# Patient Record
Sex: Male | Born: 1960 | Race: Black or African American | Hispanic: No | Marital: Single | Smoking: Never smoker
Health system: Southern US, Community
[De-identification: ages and names within clinical notes are randomized; demographics above are authoritative.]

## PROBLEM LIST (undated history)

## (undated) ENCOUNTER — Emergency Department (HOSPITAL_BASED_OUTPATIENT_CLINIC_OR_DEPARTMENT_OTHER): Admission: EM | Payer: Medicare HMO | Source: Home / Self Care

## (undated) DIAGNOSIS — I10 Essential (primary) hypertension: Secondary | ICD-10-CM

## (undated) DIAGNOSIS — M109 Gout, unspecified: Secondary | ICD-10-CM

## (undated) DIAGNOSIS — K219 Gastro-esophageal reflux disease without esophagitis: Secondary | ICD-10-CM

## (undated) DIAGNOSIS — B20 Human immunodeficiency virus [HIV] disease: Secondary | ICD-10-CM

## (undated) DIAGNOSIS — E785 Hyperlipidemia, unspecified: Secondary | ICD-10-CM

## (undated) DIAGNOSIS — I4891 Unspecified atrial fibrillation: Secondary | ICD-10-CM

## (undated) DIAGNOSIS — E559 Vitamin D deficiency, unspecified: Secondary | ICD-10-CM

## (undated) DIAGNOSIS — I509 Heart failure, unspecified: Secondary | ICD-10-CM

## (undated) DIAGNOSIS — Z21 Asymptomatic human immunodeficiency virus [HIV] infection status: Secondary | ICD-10-CM

## (undated) HISTORY — PX: IMPLANTABLE CARDIOVERTER DEFIBRILLATOR IMPLANT: SHX5860

## (undated) HISTORY — PX: LAPAROSCOPIC GASTRIC SLEEVE RESECTION: SHX5895

## (undated) HISTORY — PX: PANNICULECTOMY: SUR1001

---

## 2012-10-12 DIAGNOSIS — M179 Osteoarthritis of knee, unspecified: Secondary | ICD-10-CM

## 2012-10-12 HISTORY — DX: Osteoarthritis of knee, unspecified: M17.9

## 2013-09-07 HISTORY — DX: Morbid (severe) obesity due to excess calories: E66.01

## 2015-04-17 DIAGNOSIS — I10 Essential (primary) hypertension: Secondary | ICD-10-CM | POA: Insufficient documentation

## 2015-04-17 DIAGNOSIS — I420 Dilated cardiomyopathy: Secondary | ICD-10-CM | POA: Insufficient documentation

## 2015-04-17 DIAGNOSIS — I509 Heart failure, unspecified: Secondary | ICD-10-CM | POA: Insufficient documentation

## 2015-04-17 DIAGNOSIS — Z21 Asymptomatic human immunodeficiency virus [HIV] infection status: Secondary | ICD-10-CM | POA: Insufficient documentation

## 2015-04-30 DIAGNOSIS — E1169 Type 2 diabetes mellitus with other specified complication: Secondary | ICD-10-CM

## 2015-04-30 HISTORY — DX: Type 2 diabetes mellitus with other specified complication: E11.69

## 2015-05-30 DIAGNOSIS — Z95810 Presence of automatic (implantable) cardiac defibrillator: Secondary | ICD-10-CM

## 2015-05-30 HISTORY — DX: Presence of automatic (implantable) cardiac defibrillator: Z95.810

## 2016-01-20 DIAGNOSIS — I5022 Chronic systolic (congestive) heart failure: Secondary | ICD-10-CM | POA: Insufficient documentation

## 2016-01-20 DIAGNOSIS — E7800 Pure hypercholesterolemia, unspecified: Secondary | ICD-10-CM | POA: Insufficient documentation

## 2016-01-20 DIAGNOSIS — Z9884 Bariatric surgery status: Secondary | ICD-10-CM

## 2016-01-20 HISTORY — DX: Bariatric surgery status: Z98.84

## 2017-07-01 DIAGNOSIS — H16223 Keratoconjunctivitis sicca, not specified as Sjogren's, bilateral: Secondary | ICD-10-CM

## 2017-07-01 HISTORY — DX: Keratoconjunctivitis sicca, not specified as Sjogren's, bilateral: H16.223

## 2018-01-04 DIAGNOSIS — J4550 Severe persistent asthma, uncomplicated: Secondary | ICD-10-CM

## 2018-01-04 DIAGNOSIS — G4730 Sleep apnea, unspecified: Secondary | ICD-10-CM

## 2018-01-04 HISTORY — DX: Sleep apnea, unspecified: G47.30

## 2018-01-04 HISTORY — DX: Severe persistent asthma, uncomplicated: J45.50

## 2018-01-05 DIAGNOSIS — E559 Vitamin D deficiency, unspecified: Secondary | ICD-10-CM | POA: Insufficient documentation

## 2018-01-22 ENCOUNTER — Emergency Department (HOSPITAL_BASED_OUTPATIENT_CLINIC_OR_DEPARTMENT_OTHER): Payer: Medicare HMO

## 2018-01-22 ENCOUNTER — Emergency Department (HOSPITAL_BASED_OUTPATIENT_CLINIC_OR_DEPARTMENT_OTHER)
Admission: EM | Admit: 2018-01-22 | Discharge: 2018-01-22 | Disposition: A | Discharge status: Home / Self Care | Payer: Medicare HMO | Attending: Emergency Medicine | Admitting: Emergency Medicine

## 2018-01-22 ENCOUNTER — Other Ambulatory Visit: Payer: Self-pay

## 2018-01-22 ENCOUNTER — Encounter (HOSPITAL_BASED_OUTPATIENT_CLINIC_OR_DEPARTMENT_OTHER): Payer: Self-pay | Admitting: Adult Health

## 2018-01-22 DIAGNOSIS — Z79899 Other long term (current) drug therapy: Secondary | ICD-10-CM | POA: Insufficient documentation

## 2018-01-22 DIAGNOSIS — L03115 Cellulitis of right lower limb: Secondary | ICD-10-CM

## 2018-01-22 DIAGNOSIS — I110 Hypertensive heart disease with heart failure: Secondary | ICD-10-CM | POA: Diagnosis not present

## 2018-01-22 DIAGNOSIS — M79661 Pain in right lower leg: Secondary | ICD-10-CM | POA: Diagnosis present

## 2018-01-22 DIAGNOSIS — I509 Heart failure, unspecified: Secondary | ICD-10-CM | POA: Diagnosis not present

## 2018-01-22 DIAGNOSIS — M7989 Other specified soft tissue disorders: Secondary | ICD-10-CM

## 2018-01-22 HISTORY — DX: Essential (primary) hypertension: I10

## 2018-01-22 HISTORY — DX: Vitamin D deficiency, unspecified: E55.9

## 2018-01-22 HISTORY — DX: Gastro-esophageal reflux disease without esophagitis: K21.9

## 2018-01-22 HISTORY — DX: Heart failure, unspecified: I50.9

## 2018-01-22 HISTORY — DX: Hyperlipidemia, unspecified: E78.5

## 2018-01-22 MED ORDER — SULFAMETHOXAZOLE-TRIMETHOPRIM 800-160 MG PO TABS: 1.0000 | ORAL_TABLET | Freq: Two times a day (BID) | ORAL | 0 refills | Status: AC

## 2018-01-22 MED ORDER — TRAMADOL HCL 50 MG PO TABS: 50.0000 mg | ORAL_TABLET | Freq: Four times a day (QID) | ORAL | 0 refills | Status: AC | PRN

## 2018-01-22 MED ORDER — CEPHALEXIN 500 MG PO CAPS: 500.0000 mg | ORAL_CAPSULE | Freq: Four times a day (QID) | ORAL | 0 refills | Status: DC

## 2018-01-22 NOTE — ED Provider Notes (Signed)
[facility] EMERGENCY DEPARTMENT Provider Note   CSN: [id number] Arrival date & time: 01/22/18  1256     History   Chief Complaint Chief Complaint  Patient presents with  . Leg Swelling    HPI Leonard Mason is a 57 y.o. male.  The history is provided by the patient and medical records. No language interpreter was used.   Leonard Mason is a 57 y.o. male  with a PMH of CHF, HTN, HLD who presents to the Emergency Department complaining of right calf pain x 1 week. Pain is described as an aching pain. About 5 days ago, he noticed swelling to the left calf and redness to the area. He denies any known trauma or inciting event. Not a diabetic. Pain is gradually worsening and now radiating down the leg. This morning, he noticed redness to the left lower leg as well. Taken Tylenol with some relief. No prior hx of DVT.  No chest pain, shortness of breath, fevers. No recent surgeries / immobilizations or long trips.   Past Medical History:  Diagnosis Date  . CHF (congestive heart failure) (HCC)   . GERD (gastroesophageal reflux disease)   . Hyperlipidemia   . Hypertension   . Vitamin D deficiency     There are no active problems to display for this patient.     Home Medications    Prior to Admission medications   Medication Sig Start Date End Date Taking? Authorizing Provider  losartan (COZAAR) 50 MG tablet Take 50 mg by mouth daily.   Yes [provider]  cephALEXin (KEFLEX) 500 MG capsule Take 1 capsule (500 mg total) by mouth 4 (four) times daily. 01/22/18   Vaneta Hammontree, Chase Picket, PA-C  sulfamethoxazole-trimethoprim (BACTRIM DS,SEPTRA DS) 800-160 MG tablet Take 1 tablet by mouth 2 (two) times daily for 7 days. 01/22/18 01/29/18  Khayree Delellis, Chase Picket, PA-C  traMADol (ULTRAM) 50 MG tablet Take 1 tablet (50 mg total) by mouth every 6 (six) hours as needed. 01/22/18   Jamieon Lannen, Chase Picket, PA-C    Family History No family history on file.  Social History Social History    Tobacco Use  . Smoking status: Not on file  Substance Use Topics  . Alcohol use: Not on file  . Drug use: Not on file     Allergies   Patient has no known allergies.   Review of Systems Review of Systems  Respiratory: Negative for shortness of breath.   Cardiovascular: Positive for leg swelling. Negative for chest pain and palpitations.  Musculoskeletal: Positive for myalgias.  Skin: Positive for color change.  Neurological: Negative for weakness and numbness.  All other systems reviewed and are negative.    Physical Exam Updated Vital Signs BP 96/68 (BP Location: Right Arm)   Pulse 83   Temp 98.4 F (36.9 C) (Oral)   Resp 16   Ht 5\' 11"  (1.803 m)   Wt (!) 137.4 kg (303 lb)   SpO2 99%   BMI 42.26 kg/m   Physical Exam  Constitutional: He is oriented to person, place, and time. He appears well-developed and well-nourished. No distress.  HENT:  Head: Normocephalic and atraumatic.  Cardiovascular: Normal rate, regular rhythm and normal heart sounds.  No murmur heard. Pulmonary/Chest: Effort normal and breath sounds normal. No respiratory distress.  Abdominal: Soft. He exhibits no distension. There is no tenderness.  Musculoskeletal:  Tenderness to palpation of left calf and distal left lower extremity with overlying erythema which is warm to the  touch. Full ROM of the ankle and knee without any difficulty or pain. Patch of erythema to the right lower leg as well. 2+ DP bilaterally. Sensation intact.   Neurological: He is alert and oriented to person, place, and time.  Bilateral lower extremities neurovascularly intact.   Skin: Skin is warm and dry.  Nursing note and vitals reviewed.    ED Treatments / Results  Labs (all labs ordered are listed, but only abnormal results are displayed) Labs Reviewed - No data to display  EKG  EKG Interpretation None       Radiology US Venous Img Lower Bilateral  Result Date: 01/22/2018 CLINICAL DATA:  Bilateral lower  extremity pain and edema. History of varicose veins. Evaluate for DVT. EXAM: BILATERAL LOWER EXTREMITY VENOUS DOPPLER ULTRASOUND TECHNIQUE: Gray-scale sonography with graded compression, as well as color Doppler and duplex ultrasound were performed to evaluate the lower extremity deep venous systems from the level of the common femoral vein and including the common femoral, femoral, profunda femoral, popliteal and calf veins including the posterior tibial, peroneal and gastrocnemius veins when visible. The superficial great saphenous vein was also interrogated. Spectral Doppler was utilized to evaluate flow at rest and with distal augmentation maneuvers in the common femoral, femoral and popliteal veins. COMPARISON:  None. FINDINGS: RIGHT LOWER EXTREMITY Common Femoral Vein: No evidence of thrombus. Normal compressibility, respiratory phasicity and response to augmentation. Saphenofemoral Junction: No evidence of thrombus. Normal compressibility and flow on color Doppler imaging. Profunda Femoral Vein: No evidence of thrombus. Normal compressibility and flow on color Doppler imaging. Femoral Vein: No evidence of thrombus. Normal compressibility, respiratory phasicity and response to augmentation. Popliteal Vein: No evidence of thrombus. Normal compressibility, respiratory phasicity and response to augmentation. Calf Veins: No evidence of thrombus. Normal compressibility and flow on color Doppler imaging. Superficial Great Saphenous Vein: No evidence of thrombus. Normal compressibility. Venous Reflux:  None. Other Findings: Note is made of several prominent though widely patent superficial varicosities at the level the right calf (images 27 through 29). Note is made of a approximately 6.3 x 1.1 x 5.5 cm serpiginous fluid collection with the right popliteal fossa favored to represent a Baker cyst. LEFT LOWER EXTREMITY Common Femoral Vein: No evidence of thrombus. Normal compressibility, respiratory phasicity and  response to augmentation. Saphenofemoral Junction: No evidence of thrombus. Normal compressibility and flow on color Doppler imaging. Profunda Femoral Vein: No evidence of thrombus. Normal compressibility and flow on color Doppler imaging. Femoral Vein: No evidence of thrombus. Normal compressibility, respiratory phasicity and response to augmentation. Popliteal Vein: No evidence of thrombus. Normal compressibility, respiratory phasicity and response to augmentation. Calf Veins: No evidence of thrombus. Normal compressibility and flow on color Doppler imaging. Superficial Great Saphenous Vein: No evidence of thrombus. Normal compressibility. Venous Reflux:  None. Other Findings: Note is made of several prominent though widely patent superficial varicosities at the level of the left calf (representative images 62 through 65). IMPRESSION: 1. No evidence of DVT within either lower extremity. 2. Prominent though widely patent superficial varicosities within the bilateral calves. 3. Incidentally noted approximately 6.3 cm right-sided Baker's cyst. Electronically Signed   By: Simonne Come M.D.   On: 01/22/2018 16:45    Procedures Procedures (including critical care time)  Medications Ordered in ED Medications - No data to display   Initial Impression / Assessment and Plan / ED Course  I have reviewed the triage vital signs and the nursing notes.  Pertinent labs & imaging results that were available during  my care of the patient were reviewed by me and considered in my medical decision making (see chart for details).    Leonard Mason is a 57 y.o. male who presents to ED for bilateral lower extremity pain, right > left x 1 week with erythema developing 5 days ago and progressively worsening. No open wounds on exam. Full ROM of the knees and ankles. Erythema is mostly to the calves and distal extremities. Doubt septic joint. Area is warm to the touch. He is not diabetic. DVT studies negative. Will start on ABX  and have patient follow up with PCP. He reports being able to follow up with his pcp promptly. Area demarcated and dated. Return precautions discussed and all questions answered.   Final Clinical Impressions(s) / ED Diagnoses   Final diagnoses:  Leg swelling  Cellulitis of right lower extremity    ED Discharge Orders        Ordered    sulfamethoxazole-trimethoprim (BACTRIM DS,SEPTRA DS) 800-160 MG tablet  2 times daily     01/22/18 1716    cephALEXin (KEFLEX) 500 MG capsule  4 times daily     01/22/18 1716    traMADol (ULTRAM) 50 MG tablet  Every 6 hours PRN     01/22/18 1716       Azaela Caracci, Chase PicketJaime Pilcher, PA-C 01/22/18 1930    Tilden Fossaees, Elizabeth, MD 01/23/18 [phone]

## 2018-01-22 NOTE — ED Triage Notes (Signed)
PREsents with redness, pain d swelling to left leg began last Sunday, it started in ankle and is now progressing up the leg and is warm.

## 2018-01-22 NOTE — Discharge Instructions (Signed)
It was my pleasure taking care of you today!   You had a vascular ultrasound today which was negative for DVT (blood clot).   Please take all of your antibiotics until finished!  Please call your primary care doctor in the morning to schedule a follow up appointment for further evaluation and recheck.   Return to ER immediately for fevers, worsening redness, new symptoms, any additional concerns.

## 2018-06-14 DIAGNOSIS — M1A09X0 Idiopathic chronic gout, multiple sites, without tophus (tophi): Secondary | ICD-10-CM

## 2018-06-14 HISTORY — DX: Idiopathic chronic gout, multiple sites, without tophus (tophi): M1A.09X0

## 2018-09-03 ENCOUNTER — Emergency Department (HOSPITAL_BASED_OUTPATIENT_CLINIC_OR_DEPARTMENT_OTHER)
Admission: EM | Admit: 2018-09-03 | Discharge: 2018-09-03 | Disposition: A | Discharge status: Home / Self Care | Payer: Medicare HMO | Attending: Emergency Medicine | Admitting: Emergency Medicine

## 2018-09-03 ENCOUNTER — Encounter (HOSPITAL_BASED_OUTPATIENT_CLINIC_OR_DEPARTMENT_OTHER): Payer: Self-pay

## 2018-09-03 ENCOUNTER — Emergency Department (HOSPITAL_BASED_OUTPATIENT_CLINIC_OR_DEPARTMENT_OTHER): Payer: Medicare HMO

## 2018-09-03 ENCOUNTER — Other Ambulatory Visit: Payer: Self-pay

## 2018-09-03 DIAGNOSIS — Z79899 Other long term (current) drug therapy: Secondary | ICD-10-CM | POA: Diagnosis not present

## 2018-09-03 DIAGNOSIS — N289 Disorder of kidney and ureter, unspecified: Secondary | ICD-10-CM | POA: Insufficient documentation

## 2018-09-03 DIAGNOSIS — R05 Cough: Secondary | ICD-10-CM | POA: Diagnosis present

## 2018-09-03 DIAGNOSIS — J189 Pneumonia, unspecified organism: Secondary | ICD-10-CM | POA: Insufficient documentation

## 2018-09-03 DIAGNOSIS — Z95810 Presence of automatic (implantable) cardiac defibrillator: Secondary | ICD-10-CM | POA: Insufficient documentation

## 2018-09-03 DIAGNOSIS — E876 Hypokalemia: Secondary | ICD-10-CM | POA: Insufficient documentation

## 2018-09-03 DIAGNOSIS — I509 Heart failure, unspecified: Secondary | ICD-10-CM | POA: Insufficient documentation

## 2018-09-03 DIAGNOSIS — Z7982 Long term (current) use of aspirin: Secondary | ICD-10-CM | POA: Diagnosis not present

## 2018-09-03 DIAGNOSIS — Z21 Asymptomatic human immunodeficiency virus [HIV] infection status: Secondary | ICD-10-CM | POA: Insufficient documentation

## 2018-09-03 DIAGNOSIS — I110 Hypertensive heart disease with heart failure: Secondary | ICD-10-CM | POA: Insufficient documentation

## 2018-09-03 HISTORY — DX: Human immunodeficiency virus (HIV) disease: B20

## 2018-09-03 HISTORY — DX: Gout, unspecified: M10.9

## 2018-09-03 HISTORY — DX: Asymptomatic human immunodeficiency virus (hiv) infection status: Z21

## 2018-09-03 LAB — CBC WITH DIFFERENTIAL/PLATELET
Abs Immature Granulocytes: 0.02 10*3/uL (ref 0.00–0.07)
BASOS ABS: 0 10*3/uL (ref 0.0–0.1)
BASOS PCT: 0 %
EOS ABS: 0.1 10*3/uL (ref 0.0–0.5)
EOS PCT: 2 %
HEMATOCRIT: 45.3 % (ref 39.0–52.0)
Hemoglobin: 13.9 g/dL (ref 13.0–17.0)
IMMATURE GRANULOCYTES: 0 %
LYMPHS ABS: 1.5 10*3/uL (ref 0.7–4.0)
Lymphocytes Relative: 24 %
MCH: 27.9 pg (ref 26.0–34.0)
MCHC: 30.7 g/dL (ref 30.0–36.0)
MCV: 90.8 fL (ref 80.0–100.0)
Monocytes Absolute: 0.8 10*3/uL (ref 0.1–1.0)
Monocytes Relative: 13 %
NEUTROS PCT: 61 %
NRBC: 0 % (ref 0.0–0.2)
Neutro Abs: 3.8 10*3/uL (ref 1.7–7.7)
PLATELETS: 171 10*3/uL (ref 150–400)
RBC: 4.99 MIL/uL (ref 4.22–5.81)
RDW: 14.2 % (ref 11.5–15.5)
WBC: 6.2 10*3/uL (ref 4.0–10.5)

## 2018-09-03 LAB — BASIC METABOLIC PANEL
ANION GAP: 14 (ref 5–15)
BUN: 23 mg/dL — ABNORMAL HIGH (ref 6–20)
CALCIUM: 8.2 mg/dL — AB (ref 8.9–10.3)
CO2: 22 mmol/L (ref 22–32)
CREATININE: 1.5 mg/dL — AB (ref 0.61–1.24)
Chloride: 104 mmol/L (ref 98–111)
GFR, EST AFRICAN AMERICAN: 58 mL/min — AB (ref >60)
GFR, EST NON AFRICAN AMERICAN: 50 mL/min — AB (ref >60)
Glucose, Bld: 91 mg/dL (ref 70–99)
Potassium: 3.1 mmol/L — ABNORMAL LOW (ref 3.5–5.1)
SODIUM: 140 mmol/L (ref 135–145)

## 2018-09-03 MED ORDER — DOXYCYCLINE HYCLATE 100 MG PO TABS
100.0000 mg | ORAL_TABLET | Freq: Once | ORAL | Status: AC
  Administered 2018-09-03: 100 mg via ORAL
  Filled 2018-09-03: qty 1

## 2018-09-03 MED ORDER — DOXYCYCLINE HYCLATE 100 MG PO CAPS: 100.0000 mg | ORAL_CAPSULE | Freq: Two times a day (BID) | ORAL | 0 refills | Status: DC

## 2018-09-03 MED ORDER — POTASSIUM CHLORIDE CRYS ER 20 MEQ PO TBCR
40.0000 meq | EXTENDED_RELEASE_TABLET | Freq: Once | ORAL | Status: AC
  Administered 2018-09-03: 40 meq via ORAL
  Filled 2018-09-03: qty 2

## 2018-09-03 NOTE — ED Provider Notes (Addendum)
[facility] EMERGENCY DEPARTMENT Provider Note   CSN: [id number] Arrival date & time: 09/03/18  1438     History   Chief Complaint Chief Complaint  Patient presents with  . URI    HPI Leonard Mason is a 57 y.o. male.  HPI She reports" I have had this cold for 2 weeks". He reports cough for 2 weeks with clear mucus which is sometimes blood-tinged accompanied by rhinorrhea.  He denies fever.  He is treated himself with his albuterol inhaler along with over-the-counter cough medicine, without relief.  He complains of shortness of breath for "a few seconds when I bend over to tie my shoes.  No nausea or vomiting.  He has chest pain at his anterior chest only with coughing.  No other associated symptoms.  Nothing makes symptoms better. Past Medical History:  Diagnosis Date  . CHF (congestive heart failure) (HCC)   . GERD (gastroesophageal reflux disease)   . Gout   . HIV (human immunodeficiency virus infection) (HCC)   . Hyperlipidemia   . Hypertension   . Vitamin D deficiency   HIV positive reports viral load undetectable  There are no active problems to display for this patient.   Past Surgical History:  Procedure Laterality Date  . IMPLANTABLE CARDIOVERTER DEFIBRILLATOR IMPLANT    . LAPAROSCOPIC GASTRIC SLEEVE RESECTION    . PANNICULECTOMY          Home Medications    Prior to Admission medications   Medication Sig Start Date End Date Taking? Authorizing Provider  albuterol (PROVENTIL HFA;VENTOLIN HFA) 108 (90 Base) MCG/ACT inhaler INHALE 2 PUFFS INTO THE LUNGS EVERY 6 HOURS AS NEEDED FOR WHEEZING 01/06/18  Yes [provider]  allopurinol (ZYLOPRIM) 300 MG tablet Take by mouth. 08/10/18  Yes [provider]  aspirin 81 MG chewable tablet Chew by mouth. 11/04/14  Yes [provider]  bictegravir-emtricitabine-tenofovir AF (BIKTARVY) 50-200-25 MG TABS tablet TAKE 1 TABLET BY MOUTH DAILY. 01/21/17  Yes [provider]    carvedilol (COREG) 12.5 MG tablet Take by mouth. 11/29/17 11/29/18 Yes [provider]  Cholecalciferol (VITAMIN D) 2000 units tablet Take by mouth. 01/17/18  Yes [provider]  colchicine 0.6 MG tablet Take 2 tablets at onset of gout pain, can take a 3rd tablet if needed but no more than 3 tablets in 24 hours. 08/10/18  Yes [provider]  docusate sodium (COLACE) 100 MG capsule Take by mouth. 06/14/18 06/14/19 Yes [provider]  esomeprazole (NEXIUM) 40 MG capsule TAKE 1 CAPSULE BY MOUTH DAILY 03/21/15  Yes [provider]  furosemide (LASIX) 80 MG tablet Take by mouth. 01/21/16  Yes [provider]  ketoconazole (NIZORAL) 2 % shampoo APPLY EXTERNALLY TO THE SKIN DAILY. LEAVE ON FOR 3 TO 5 MINUTES THEN WASH OFF. AVOID EYES 02/19/15  Yes [provider]  losartan-hydrochlorothiazide (HYZAAR) 100-12.5 MG tablet Take by mouth. 03/21/15  Yes [provider]  metFORMIN (GLUCOPHAGE) 1000 MG tablet Take by mouth. 02/19/15  Yes [provider]  mometasone (ASMANEX) 220 MCG/INH inhaler Inhale into the lungs. 01/05/18 01/05/19 Yes [provider]  pravastatin (PRAVACHOL) 80 MG tablet TK 1 T PO DAILY QHS 05/15/18  Yes [provider]  Psyllium (METAMUCIL) 48.57 % POWD Use as directed 06/14/18  Yes [provider]  cephALEXin (KEFLEX) 500 MG capsule Take 1 capsule (500 mg total) by mouth 4 (four) times daily. 01/22/18   Ward, Chase Picket, PA-C  Cyanocobalamin (VITAMIN B-12) 2000  MCG TBCR Take by mouth.    [provider]  losartan (COZAAR) 50 MG tablet Take 50 mg by mouth daily.    [provider]  potassium chloride SA (K-DUR,KLOR-CON) 20 MEQ tablet Take by mouth.    [provider]  traMADol (ULTRAM) 50 MG tablet Take 1 tablet (50 mg total) by mouth every 6 (six) hours as needed. 01/22/18   Ward, Chase Picket, PA-C    Family History No family history on file.  Social  History Social History   Tobacco Use  . Smoking status: Never Smoker  . Smokeless tobacco: Never Used  Substance Use Topics  . Alcohol use: Not Currently    Comment: occ  . Drug use: Never     Allergies   Niacin and Latex   Review of Systems Review of Systems  Constitutional: Negative.   HENT: Positive for rhinorrhea.   Respiratory: Positive for cough and shortness of breath.   Cardiovascular: Positive for chest pain.       Syncope  Gastrointestinal: Negative.   Musculoskeletal: Negative.   Skin: Negative.   Allergic/Immunologic: Negative.   Neurological: Negative.   Psychiatric/Behavioral: Negative.   All other systems reviewed and are negative.    Physical Exam Updated Vital Signs BP 99/76 (BP Location: Left Arm)   Pulse 82   Temp 98.6 F (37 C) (Oral)   Resp 20   Ht 5\' 11"  (1.803 m)   Wt 135.2 kg   SpO2 96%   BMI 41.56 kg/m   Physical Exam  Constitutional: He is oriented to person, place, and time. He appears well-developed and well-nourished. No distress.  HENT:  Head: Normocephalic and atraumatic.  Eyes: Pupils are equal, round, and reactive to light. Conjunctivae are normal.  Neck: Neck supple. No JVD present. No tracheal deviation present. No thyromegaly present.  Cardiovascular: Normal rate and regular rhythm.  No murmur heard. Pulmonary/Chest: Effort normal and breath sounds normal.  Abdominal: Soft. Bowel sounds are normal. He exhibits no distension. There is no tenderness.  Obese  Musculoskeletal: Normal range of motion. He exhibits no edema or tenderness.  Neurological: He is alert and oriented to person, place, and time. Coordination normal.  Skin: Skin is warm and dry. No rash noted.  Psychiatric: He has a normal mood and affect.  Nursing note and vitals reviewed.    ED Treatments / Results  Labs (all labs ordered are listed, but only abnormal results are displayed) Labs Reviewed - No data to display  EKG EKG  Interpretation  Date/Time:  Sunday September 03 2018 15:04:22 EDT Ventricular Rate:  86 PR Interval:  194 QRS Duration: 114 QT Interval:  422 QTC Calculation: 504 R Axis:   -25 Text Interpretation:  Sinus rhythm with occasional Premature ventricular complexes and Premature atrial complexes T wave abnormality, consider lateral ischemia Prolonged QT Abnormal ECG No old tracing to compare Confirmed by Doug Sou [phone]) on 09/03/2018 3:11:28 PM  Results for orders placed or performed during the hospital encounter of 09/03/18  Basic metabolic panel  Result Value Ref Range   Sodium 140 135 - 145 mmol/L   Potassium 3.1 (L) 3.5 - 5.1 mmol/L   Chloride 104 98 - 111 mmol/L   CO2 22 22 - 32 mmol/L   Glucose, Bld 91 70 - 99 mg/dL   BUN 23 (H) 6 - 20 mg/dL   Creatinine, Ser 6.04 (H) 0.61 - 1.24 mg/dL   Calcium 8.2 (L) 8.9 - 10.3 mg/dL   GFR calc non Af  Amer 50 (L) >60 mL/min   GFR calc Af Amer 58 (L) >60 mL/min   Anion gap 14 5 - 15  CBC with Differential/Platelet  Result Value Ref Range   WBC 6.2 4.0 - 10.5 K/uL   RBC 4.99 4.22 - 5.81 MIL/uL   Hemoglobin 13.9 13.0 - 17.0 g/dL   HCT 86.5 78.4 - 69.6 %   MCV 90.8 80.0 - 100.0 fL   MCH 27.9 26.0 - 34.0 pg   MCHC 30.7 30.0 - 36.0 g/dL   RDW 29.5 28.4 - 13.2 %   Platelets 171 150 - 400 K/uL   nRBC 0.0 0.0 - 0.2 %   Neutrophils Relative % 61 %   Neutro Abs 3.8 1.7 - 7.7 K/uL   Lymphocytes Relative 24 %   Lymphs Abs 1.5 0.7 - 4.0 K/uL   Monocytes Relative 13 %   Monocytes Absolute 0.8 0.1 - 1.0 K/uL   Eosinophils Relative 2 %   Eosinophils Absolute 0.1 0.0 - 0.5 K/uL   Basophils Relative 0 %   Basophils Absolute 0.0 0.0 - 0.1 K/uL   Immature Granulocytes 0 %   Abs Immature Granulocytes 0.02 0.00 - 0.07 K/uL   Dg Chest 2 View  Result Date: 09/03/2018 CLINICAL DATA:  Cough and intermittent shortness of breath for 2 weeks. No fever. Some coughing up blood yesterday. EXAM: CHEST - 2 VIEW COMPARISON:  05/12/2015 and older exams.  FINDINGS: Cardiac silhouette is mildly enlarged. No mediastinal or hilar masses. No evidence of adenopathy. Left anterior chest wall AICD is stable from the most recent prior study, well-positioned. Mild opacities noted at the right lung base adjacent to an elevated right hemidiaphragm. This is likely atelectasis. Pneumonia is possible. Remainder of the lungs is clear. No pleural effusion or pneumothorax. Skeletal structures are intact. IMPRESSION: 1. Right lung base opacity adjacent to an elevated hemidiaphragm. This is consistent with atelectasis or pneumonia. 2. No other evidence of acute cardiopulmonary disease. Electronically Signed   By: Amie Portland M.D.   On: 09/03/2018 15:54   Radiology No results found.  Procedures Procedures (including critical care time)  Medications Ordered in ED Medications - No data to display   Initial Impression / Assessment and Plan / ED Course  I have reviewed the triage vital signs and the nursing notes.Hhe is suitable for outpatient therapy for community acquired pneumonia.  Port score equals 68 Pertinent labs & imaging results that were available during my care of the patient were reviewed by me and considered in my medical decision making (see chart for details).     5:55 PM patient was comfort Lab work consistent with mild renal insufficiency and hypokalemia 6 reviewed by me, plan prescription doxycycline 100 mg twice daily for 7 days.  Follow-up with PMD if not better in a week.  Patient advised as to the lab work abnormalities. Final Clinical Impressions(s) / ED Diagnoses  Diagnoses #1 community-acquired pneumonia #2 hypokalemia #3 renal insufficiency Final diagnoses:  None    ED Discharge Orders    None       Doug Sou, MD 09/03/18 1759    Doug Sou, MD 09/03/18 1800

## 2018-09-03 NOTE — ED Triage Notes (Signed)
Pt c/o URI symptoms, cough and intermittent ShOB x 2 weeks. Denies fever. Yesterday started coughing up blood.

## 2018-09-03 NOTE — ED Notes (Signed)
ED Provider at bedside. 

## 2018-09-03 NOTE — Discharge Instructions (Addendum)
Your blood potassium today was 3.1 which is low.  Your kidney function is not normal.  Blood creatinine level is 1.5, slightly elevated.  Have an old value for comparison.  Call your primary care doctor to get your blood creatinine level rechecked within the next 2 or 3 weeks or ask him to compare today's value to an old lab result.  Return if your condition worsens for any reason or see your primary care physician

## 2018-11-25 DIAGNOSIS — I484 Atypical atrial flutter: Secondary | ICD-10-CM

## 2018-11-25 HISTORY — DX: Atypical atrial flutter: I48.4

## 2018-12-25 ENCOUNTER — Other Ambulatory Visit: Payer: Self-pay

## 2018-12-25 ENCOUNTER — Emergency Department (HOSPITAL_BASED_OUTPATIENT_CLINIC_OR_DEPARTMENT_OTHER): Payer: Medicare HMO

## 2018-12-25 ENCOUNTER — Emergency Department (HOSPITAL_BASED_OUTPATIENT_CLINIC_OR_DEPARTMENT_OTHER)
Admission: EM | Admit: 2018-12-25 | Discharge: 2018-12-25 | Disposition: A | Discharge status: Home / Self Care | Payer: Medicare HMO | Attending: Emergency Medicine | Admitting: Emergency Medicine

## 2018-12-25 ENCOUNTER — Encounter (HOSPITAL_BASED_OUTPATIENT_CLINIC_OR_DEPARTMENT_OTHER): Payer: Self-pay

## 2018-12-25 DIAGNOSIS — Z79899 Other long term (current) drug therapy: Secondary | ICD-10-CM | POA: Diagnosis not present

## 2018-12-25 DIAGNOSIS — Z9884 Bariatric surgery status: Secondary | ICD-10-CM | POA: Diagnosis not present

## 2018-12-25 DIAGNOSIS — Z91040 Latex allergy status: Secondary | ICD-10-CM | POA: Insufficient documentation

## 2018-12-25 DIAGNOSIS — Z95810 Presence of automatic (implantable) cardiac defibrillator: Secondary | ICD-10-CM | POA: Diagnosis not present

## 2018-12-25 DIAGNOSIS — I509 Heart failure, unspecified: Secondary | ICD-10-CM | POA: Diagnosis not present

## 2018-12-25 DIAGNOSIS — Z7982 Long term (current) use of aspirin: Secondary | ICD-10-CM | POA: Insufficient documentation

## 2018-12-25 DIAGNOSIS — I110 Hypertensive heart disease with heart failure: Secondary | ICD-10-CM | POA: Insufficient documentation

## 2018-12-25 DIAGNOSIS — B20 Human immunodeficiency virus [HIV] disease: Secondary | ICD-10-CM | POA: Insufficient documentation

## 2018-12-25 DIAGNOSIS — R2243 Localized swelling, mass and lump, lower limb, bilateral: Secondary | ICD-10-CM | POA: Diagnosis present

## 2018-12-25 HISTORY — DX: Unspecified atrial fibrillation: I48.91

## 2018-12-25 LAB — CBC WITH DIFFERENTIAL/PLATELET
Abs Immature Granulocytes: 0.02 10*3/uL (ref 0.00–0.07)
Basophils Absolute: 0 10*3/uL (ref 0.0–0.1)
Basophils Relative: 0 %
EOS PCT: 2 %
Eosinophils Absolute: 0.2 10*3/uL (ref 0.0–0.5)
HCT: 43.2 % (ref 39.0–52.0)
Hemoglobin: 12.7 g/dL — ABNORMAL LOW (ref 13.0–17.0)
Immature Granulocytes: 0 %
Lymphocytes Relative: 21 %
Lymphs Abs: 1.3 10*3/uL (ref 0.7–4.0)
MCH: 27.3 pg (ref 26.0–34.0)
MCHC: 29.4 g/dL — ABNORMAL LOW (ref 30.0–36.0)
MCV: 92.9 fL (ref 80.0–100.0)
MONOS PCT: 13 %
Monocytes Absolute: 0.8 10*3/uL (ref 0.1–1.0)
Neutro Abs: 3.9 10*3/uL (ref 1.7–7.7)
Neutrophils Relative %: 64 %
Platelets: 179 10*3/uL (ref 150–400)
RBC: 4.65 MIL/uL (ref 4.22–5.81)
RDW: 16.4 % — ABNORMAL HIGH (ref 11.5–15.5)
WBC: 6.1 10*3/uL (ref 4.0–10.5)
nRBC: 0 % (ref 0.0–0.2)

## 2018-12-25 LAB — BASIC METABOLIC PANEL
Anion gap: 6 (ref 5–15)
BUN: 28 mg/dL — AB (ref 6–20)
CO2: 26 mmol/L (ref 22–32)
Calcium: 8.6 mg/dL — ABNORMAL LOW (ref 8.9–10.3)
Chloride: 107 mmol/L (ref 98–111)
Creatinine, Ser: 1.74 mg/dL — ABNORMAL HIGH (ref 0.61–1.24)
GFR calc Af Amer: 49 mL/min — ABNORMAL LOW (ref >60)
GFR calc non Af Amer: 42 mL/min — ABNORMAL LOW (ref >60)
GLUCOSE: 83 mg/dL (ref 70–99)
Potassium: 3.9 mmol/L (ref 3.5–5.1)
Sodium: 139 mmol/L (ref 135–145)

## 2018-12-25 LAB — BRAIN NATRIURETIC PEPTIDE: B Natriuretic Peptide: 283.3 pg/mL — ABNORMAL HIGH (ref 0.0–100.0)

## 2018-12-25 MED ORDER — FUROSEMIDE 10 MG/ML IJ SOLN
40.0000 mg | Freq: Once | INTRAMUSCULAR | Status: AC
  Administered 2018-12-25: 40 mg via INTRAVENOUS
  Filled 2018-12-25: qty 4

## 2018-12-25 NOTE — ED Notes (Signed)
ED Provider at bedside. 

## 2018-12-25 NOTE — ED Provider Notes (Signed)
[facility] EMERGENCY DEPARTMENT Provider Note   CSN: [id number] Arrival date & time: 12/25/18  1238     History   Chief Complaint Chief Complaint  Patient presents with  . Edema    HPI Leonard Mason is a 58 y.o. male with a past medical history of atrial fibrillation currently anticoagulated on Xarelto, CHF currently on Lasix 80 mg in the morning and 40 mg in the evening as needed, HIV, hypertension, hyperlipidemia who presents to ED for 2-day history of bilateral lower extremity edema.  States that he has a history of similar symptoms in the past which usually resolve with elevation.  However, even though he has been compliant with his Lasix he continues to have swelling.  He was recently decreased on his Lasix dose from 160 mg to daily 80mg  and prn 40mg .  States that he took his 80 mg this morning.  He states that the swelling extends up to his upper legs.  Denies any injuries or falls, abdominal pain, chest pain, shortness of breath, calf pain, recent immobilization, vomiting, URI symptoms.  HPI  Past Medical History:  Diagnosis Date  . A-fib (HCC)   . CHF (congestive heart failure) (HCC)   . GERD (gastroesophageal reflux disease)   . Gout   . HIV (human immunodeficiency virus infection) (HCC)   . Hyperlipidemia   . Hypertension   . Vitamin D deficiency     There are no active problems to display for this patient.   Past Surgical History:  Procedure Laterality Date  . IMPLANTABLE CARDIOVERTER DEFIBRILLATOR IMPLANT    . LAPAROSCOPIC GASTRIC SLEEVE RESECTION    . PANNICULECTOMY          Home Medications    Prior to Admission medications   Medication Sig Start Date End Date Taking? Authorizing Provider  albuterol (PROVENTIL HFA;VENTOLIN HFA) 108 (90 Base) MCG/ACT inhaler INHALE 2 PUFFS INTO THE LUNGS EVERY 6 HOURS AS NEEDED FOR WHEEZING 01/06/18   [provider]  allopurinol (ZYLOPRIM) 300 MG tablet Take by mouth. 08/10/18   [provider]    aspirin 81 MG chewable tablet Chew by mouth. 11/04/14   [provider]  bictegravir-emtricitabine-tenofovir AF (BIKTARVY) 50-200-25 MG TABS tablet TAKE 1 TABLET BY MOUTH DAILY. 01/21/17   [provider]  carvedilol (COREG) 12.5 MG tablet Take by mouth. 11/29/17 11/29/18  [provider]  cephALEXin (KEFLEX) 500 MG capsule Take 1 capsule (500 mg total) by mouth 4 (four) times daily. 01/22/18   Ward, Chase PicketJaime Pilcher, PA-C  Cholecalciferol (VITAMIN D) 2000 units tablet Take by mouth. 01/17/18   [provider]  colchicine 0.6 MG tablet Take 2 tablets at onset of gout pain, can take a 3rd tablet if needed but no more than 3 tablets in 24 hours. 08/10/18   [provider]  Cyanocobalamin (VITAMIN B-12) 2000 MCG TBCR Take by mouth.    [provider]  docusate sodium (COLACE) 100 MG capsule Take by mouth. 06/14/18 06/14/19  [provider]  doxycycline (VIBRAMYCIN) 100 MG capsule Take 1 capsule (100 mg total) by mouth 2 (two) times daily. One po bid x 7 days 09/03/18   Doug SouJacubowitz, Sam, MD  esomeprazole (NEXIUM) 40 MG capsule TAKE 1 CAPSULE BY MOUTH DAILY 03/21/15   [provider]  furosemide (LASIX) 80 MG tablet Take by mouth. 01/21/16   [provider]  ketoconazole (NIZORAL) 2 % shampoo APPLY EXTERNALLY TO THE SKIN DAILY. LEAVE ON FOR 3 TO 5 MINUTES THEN [facility]. AVOID EYES 02/19/15   [provider]  losartan (COZAAR) 50 MG tablet Take 50 mg by mouth daily.    [provider]  losartan-hydrochlorothiazide Mauri Reading(HYZAAR) 100-12.5 MG tablet Take by mouth. 03/21/15   [provider]  metFORMIN (GLUCOPHAGE) 1000 MG tablet Take by mouth. 02/19/15   [provider]  mometasone (ASMANEX) 220 MCG/INH inhaler Inhale into the lungs. 01/05/18 01/05/19  [provider]  potassium chloride SA (K-DUR,KLOR-CON) 20 MEQ tablet Take by mouth.    [provider]  pravastatin (PRAVACHOL) 80 MG tablet TK 1 T PO  DAILY QHS 05/15/18   [provider]  Psyllium (METAMUCIL) 48.57 % POWD Use as directed 06/14/18   [provider]  traMADol (ULTRAM) 50 MG tablet Take 1 tablet (50 mg total) by mouth every 6 (six) hours as needed. 01/22/18   Ward, Chase PicketJaime Pilcher, PA-C    Family History No family history on file.  Social History Social History   Tobacco Use  . Smoking status: Never Smoker  . Smokeless tobacco: Never Used  Substance Use Topics  . Alcohol use: Never    Frequency: Never  . Drug use: Never     Allergies   Niacin and Latex   Review of Systems Review of Systems  Constitutional: Negative for appetite change, chills and fever.  HENT: Negative for ear pain, rhinorrhea, sneezing and sore throat.   Eyes: Negative for photophobia and visual disturbance.  Respiratory: Negative for cough, chest tightness, shortness of breath and wheezing.   Cardiovascular: Positive for leg swelling. Negative for chest pain and palpitations.  Gastrointestinal: Negative for abdominal pain, blood in stool, constipation, diarrhea, nausea and vomiting.  Genitourinary: Negative for dysuria, hematuria and urgency.  Musculoskeletal: Negative for myalgias.  Skin: Negative for rash.  Neurological: Negative for dizziness, weakness and light-headedness.     Physical Exam Updated Vital Signs BP 101/75 (BP Location: Left Arm)   Pulse 74   Temp 97.8 F (36.6 C) (Oral)   Resp 20   Ht 5\' 11"  (1.803 m)   Wt (!) 157.4 kg   SpO2 99%   BMI 48.40 kg/m   Physical Exam Vitals signs and nursing note reviewed.  Constitutional:      General: He is not in acute distress.    Appearance: He is well-developed.  HENT:     Head: Normocephalic and atraumatic.     Nose: Nose normal.  Eyes:     General: No scleral icterus.       Right eye: No discharge.        Left eye: No discharge.     Conjunctiva/sclera: Conjunctivae normal.  Neck:     Musculoskeletal: Normal range of motion and neck supple.    Cardiovascular:     Rate and Rhythm: Normal rate and regular rhythm.     Heart sounds: Normal heart sounds. No murmur. No friction rub. No gallop.   Pulmonary:     Effort: Pulmonary effort is normal. No respiratory distress.     Breath sounds: Normal breath sounds.  Abdominal:     General: Bowel sounds are normal. There is no distension.     Palpations: Abdomen is soft.     Tenderness: There is no abdominal tenderness. There is no guarding.  Musculoskeletal: Normal range of motion.     Right lower leg: Edema present.     Left lower leg: Edema present.     Comments: 1+ pitting edema in bilateral lower extremities. 2+ DP pulses palpated bilaterally.  Venous stasis changes noted.  Skin:    General: Skin is warm and dry.     Findings: No rash.  Neurological:     Mental Status: He is alert.     Motor: No abnormal muscle tone.     Coordination: Coordination normal.      ED Treatments / Results  Labs (all labs ordered are listed, but only abnormal results are displayed) Labs Reviewed  CBC WITH DIFFERENTIAL/PLATELET - Abnormal; Notable for the following components:      Result Value   Hemoglobin 12.7 (*)    MCHC 29.4 (*)    RDW 16.4 (*)    All other components within normal limits  BRAIN NATRIURETIC PEPTIDE - Abnormal; Notable for the following components:   B Natriuretic Peptide 283.3 (*)    All other components within normal limits  BASIC METABOLIC PANEL - Abnormal; Notable for the following components:   BUN 28 (*)    Creatinine, Ser 1.74 (*)    Calcium 8.6 (*)    GFR calc non Af Amer 42 (*)    GFR calc Af Amer 49 (*)    All other components within normal limits    EKG EKG Interpretation  Date/Time:  Monday December 25 2018 12:59:46 EST Ventricular Rate:  86 PR Interval:    QRS Duration: 104 QT Interval:  406 QTC Calculation: 485 R Axis:   0 Text Interpretation:  Atrial flutter with variable A-V block Nonspecific T wave abnormality Abnormal ECG Since prior ECG,  patient is in atrial flutter with variable block Confirmed by Alvira Monday ([id number]) on 12/25/2018 2:52:13 PM   Radiology Dg Chest 2 View  Result Date: 12/25/2018 CLINICAL DATA:  Abdominal swelling and groin and lower extremity swelling. Recent diagnosis of atrial fibrillation. EXAM: CHEST - 2 VIEW COMPARISON:  Chest CT dated 11/25/2018 and chest x-Verne dated 11/25/2018 FINDINGS: AICD in place. Heart size and pulmonary vascularity are normal. Chronic elevation of the right hemidiaphragm with slight at secondary atelectasis at the right lung base, unchanged. No acute bone abnormality. IMPRESSION: No active cardiopulmonary disease. Chronic elevation of the right hemidiaphragm with secondary slight right base atelectasis. Electronically Signed   By: Francene Boyers M.D.   On: 12/25/2018 13:24    Procedures Procedures (including critical care time)  Medications Ordered in ED Medications  furosemide (LASIX) injection 40 mg (40 mg Intravenous Given 12/25/18 1622)     Initial Impression / Assessment and Plan / ED Course  I have reviewed the triage vital signs and the nursing notes.  Pertinent labs & imaging results that were available during my care of the patient were reviewed by me and considered in my medical decision making (see chart for details).  Clinical Course as of Dec 25 1736  [facility] Dec 25, 2018  1426 B Natriuretic Peptide(!): 283.3 [HK]  1505 Creatinine(!): 1.74 [HK]  1602 Results from office visit 12/14/18  NA 143 11/28/2018 0413  NA 140 03/06/2015 1454  K 3.8 11/28/2018 1232  K 3.9 03/06/2015 1454  CL 104 11/28/2018 0413  CL 99 03/06/2015 1454  CO2 30 11/28/2018 0413  CO2 30 03/06/2015 1454  BUN 25 (H) 11/28/2018 0413  BUN 34 (H) 01/21/2016 1722  BUN 25 (H) 03/06/2015 1454  CREATININE 1.65 (H) 11/28/2018 0413  CREATININE 1.26 03/06/2015 1454  GLU 91 11/28/2018 0413  GLU 111 (H) 03/06/2015 1454     [HK]    Clinical Course User Index [HK] Dietrich Pates, PA-C     58 year old male with a  past medical history of CHF, atrial flutter currently on Xarelto and diltiazem presents to ED for leg swelling for the past 2 days.  He reports bilateral lower extremity edema which has not improved with elevation as it has in the past.  He takes 80 mg of Lasix daily every morning and 40 mg in the evening as needed.  He denies chest pain, shortness of breath, palpitations or calf pain.  He is scheduled for a cardioversion at his cardiologist office on 01/08/2019.  On my exam there is bilateral lower extremity edema without calf tenderness or erythema.  Lungs are clear to auscultation bilaterally.  Lab work significant for creatinine of 1.7 (similar to office visit 2 weeks ago), BNP 283, CBC unremarkable.  EKG shows atrial flutter. Rate of 70s. This increased to 120 after 40mg  of IV Lasix was given.  Patient has diuresed here.  He remained symptom-free from his atrial flutter.  He is requesting discharge home and following up with his cardiologist.  Advised him to take 80 mg of Lasix daily until he is evaluated by cardiologist and to elevate extremities.  Patient is agreeable to this plan and will return to ED for any severe worsening symptoms.  Patient is hemodynamically stable, in NAD, and able to ambulate in the ED. Evaluation does not show pathology that would require ongoing emergent intervention or inpatient treatment. I explained the diagnosis to the patient. Pain has been managed and has no complaints prior to discharge. Patient is comfortable with above plan and is stable for discharge at this time. All questions were answered prior to disposition. Strict return precautions for returning to the ED were discussed. Encouraged follow up with PCP.    Portions of this note were generated with Scientist, clinical (histocompatibility and immunogenetics). Dictation errors may occur despite best attempts at proofreading.   Final Clinical Impressions(s) / ED Diagnoses   Final diagnoses:  Acute on chronic  congestive heart failure, unspecified heart failure type [facility])    ED Discharge Orders    None       Dietrich Pates, PA-C 12/25/18 1738    Alvira Monday, MD 12/29/18 2201

## 2018-12-25 NOTE — ED Triage Notes (Addendum)
C/o swelling to abd and groin and LE-started yesterday-hx of CHF and recent dx of Afib with "suppose to shock on the 17th" -NAD-steady gait with own cane

## 2018-12-25 NOTE — Discharge Instructions (Signed)
Please increase your dose of Lasix to 80 mg twice a day until you are evaluated by your primary care provider or your cardiologist. Return to the ED for worsening symptoms, increased swelling, chest pain, shortness of breath, wheezing or coughing up blood.

## 2018-12-25 NOTE — ED Notes (Signed)
Attempted another IV after first one infiltrated; unable to obtain, but was able to draw lab.

## 2019-02-27 DIAGNOSIS — N183 Chronic kidney disease, stage 3 unspecified: Secondary | ICD-10-CM

## 2019-02-27 HISTORY — DX: Chronic kidney disease, stage 3 unspecified: N18.30

## 2019-12-14 IMAGING — CR DG CHEST 2V
3 series · 3 of 3 positions shown · non-contrast
Comparison: 05/12/2015 and older exams.

CLINICAL DATA: Cough and intermittent shortness of breath for 2
weeks. No fever. Some coughing up blood yesterday.

EXAM:
CHEST - 2 VIEW

[w chest pa]
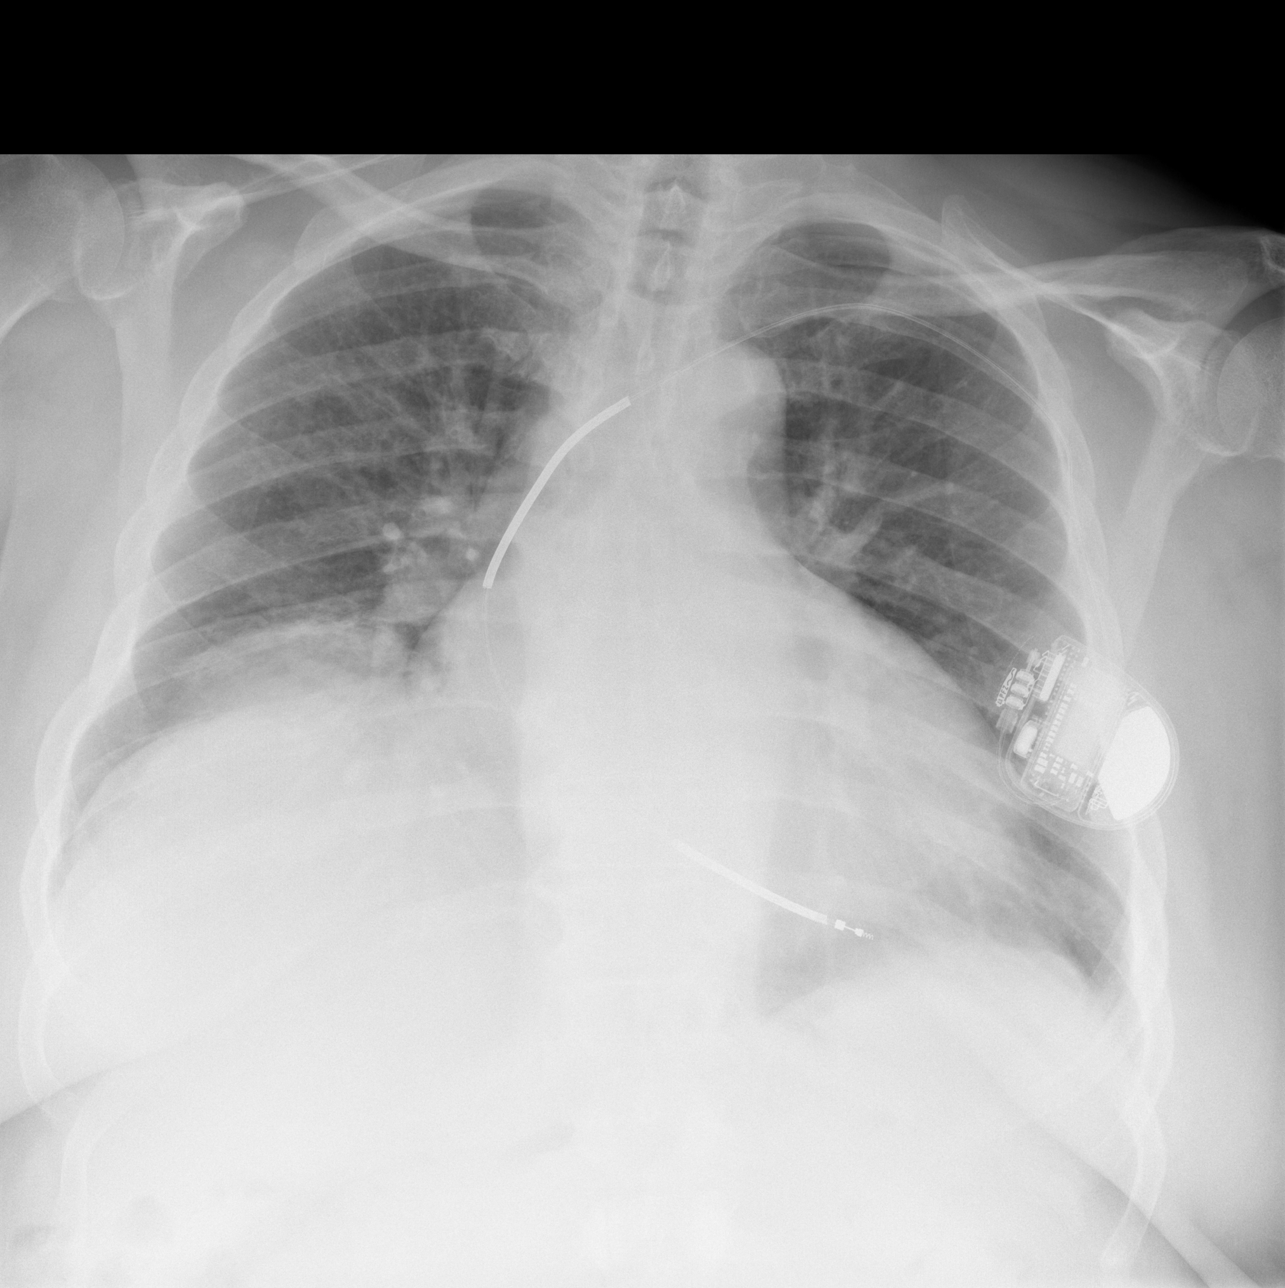

[w chest lat (1 of 2)]
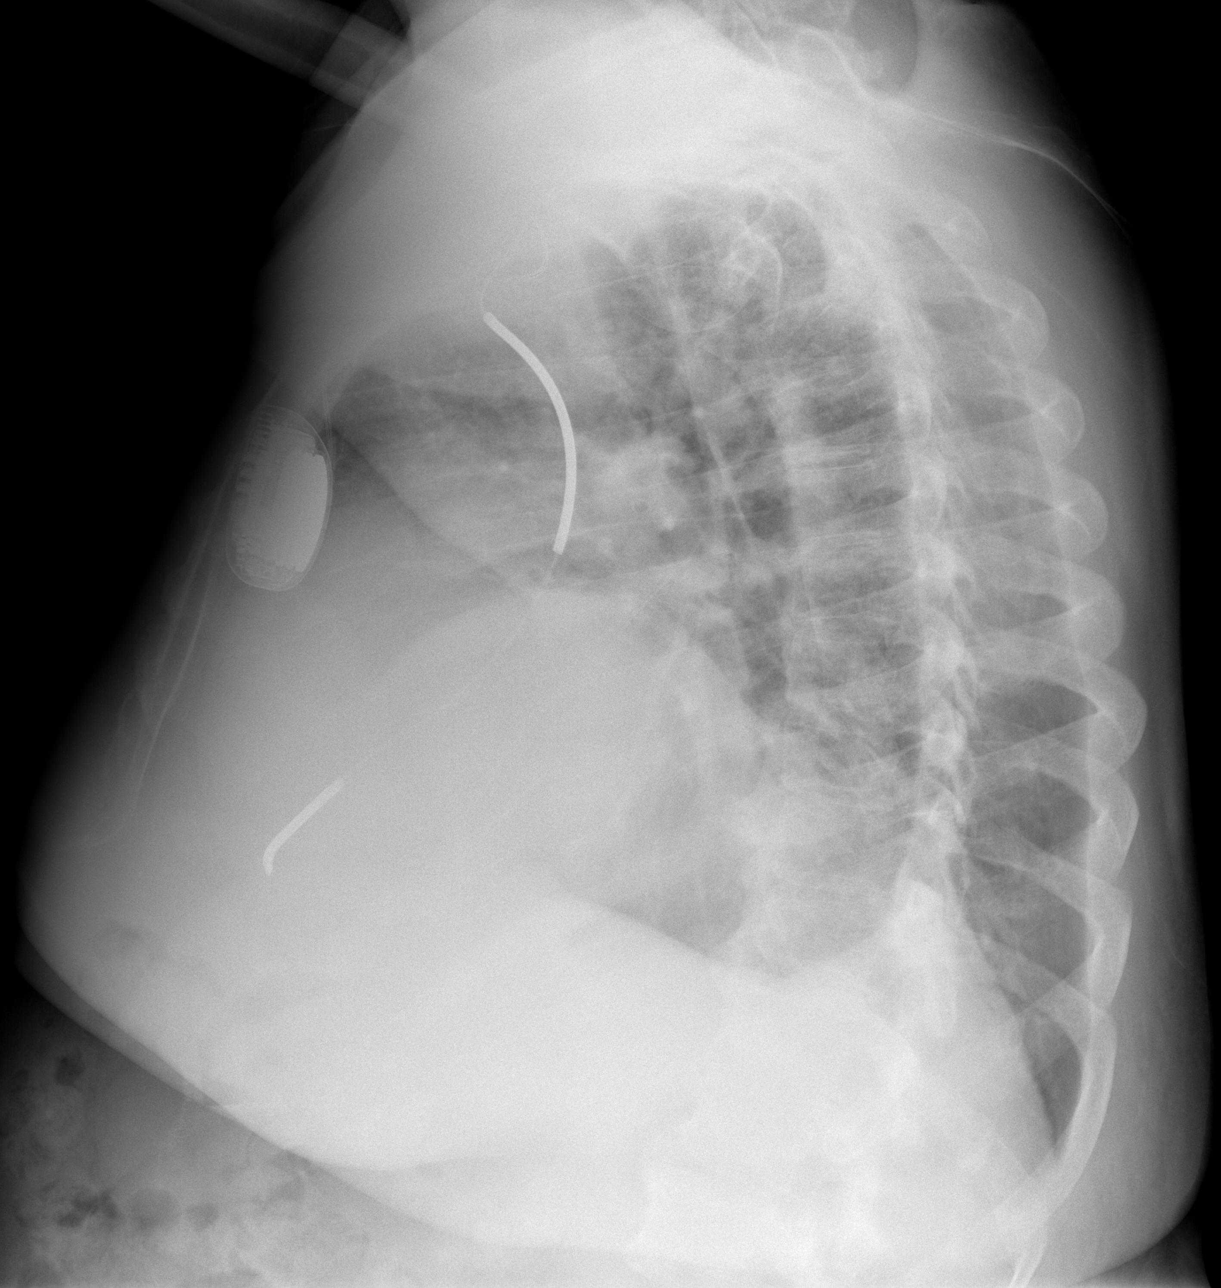

[w chest lat (2 of 2)]
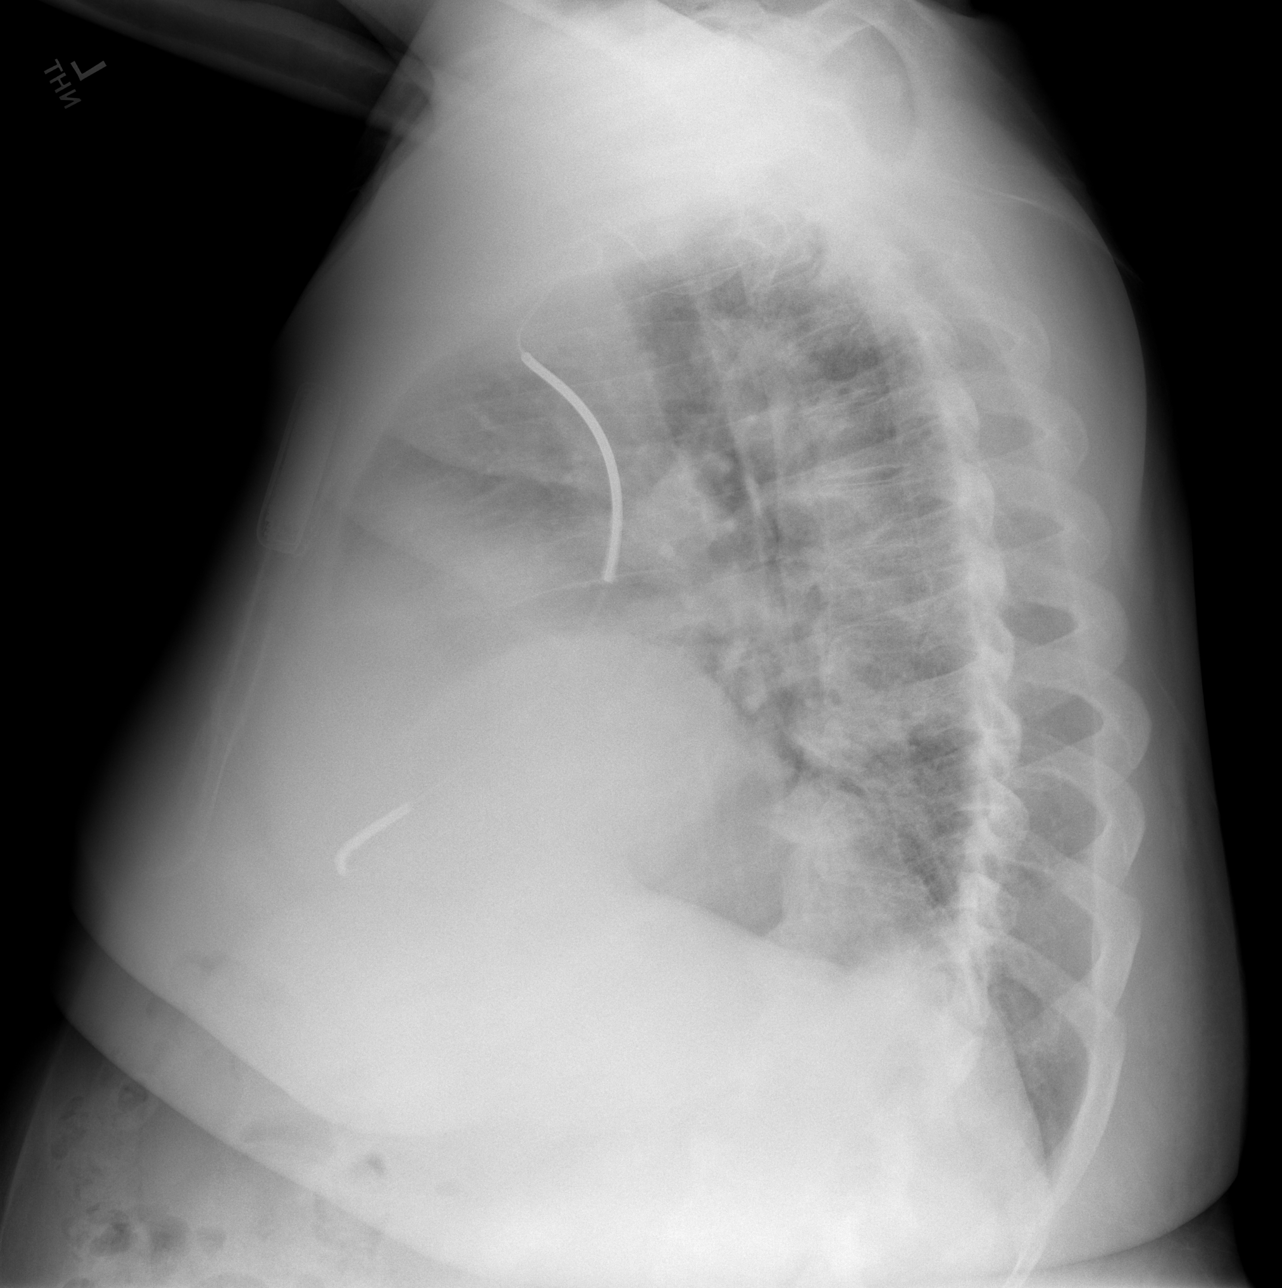

[3 of 3 positions shown; findings below may reference images not displayed]

FINDINGS: Cardiac silhouette is mildly enlarged. No mediastinal or hilar
masses. No evidence of adenopathy. Left anterior chest wall AICD is
stable from the most recent prior study, well-positioned.

Mild opacities noted at the right lung base adjacent to an elevated
right hemidiaphragm. This is likely atelectasis. Pneumonia is
possible. Remainder of the lungs is clear.

No pleural effusion or pneumothorax.

Skeletal structures are intact.
IMPRESSION: 1. Right lung base opacity adjacent to an elevated hemidiaphragm.
This is consistent with atelectasis or pneumonia.
2. No other evidence of acute cardiopulmonary disease.

## 2020-04-05 IMAGING — CR DG CHEST 2V
2 series · 2 of 2 positions shown · non-contrast
Comparison: Chest CT dated 11/25/2018 and chest x-ray dated
11/25/2018

CLINICAL DATA: Abdominal swelling and groin and lower extremity
swelling. Recent diagnosis of atrial fibrillation.

EXAM:
CHEST - 2 VIEW

[w chest pa]
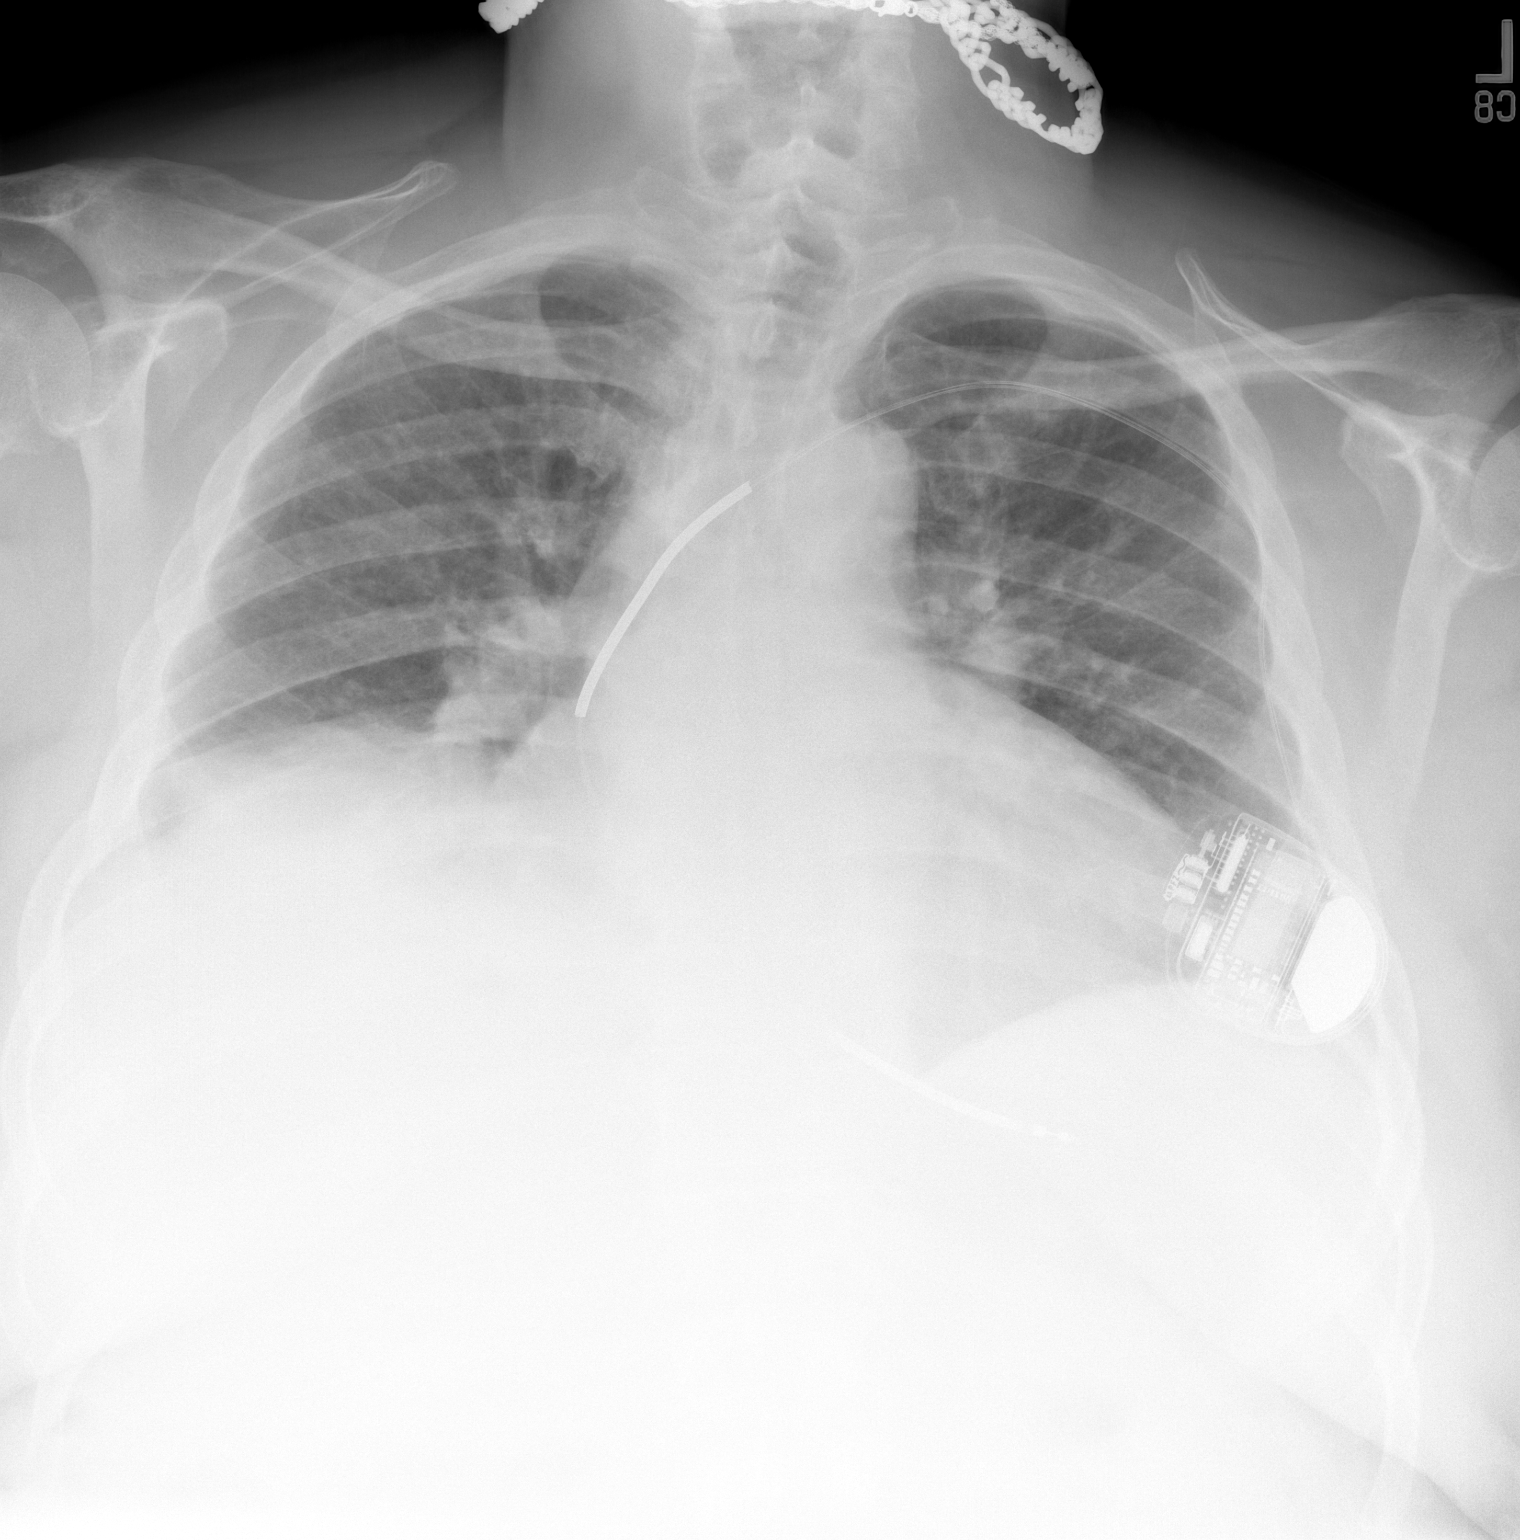

[w chest lat]
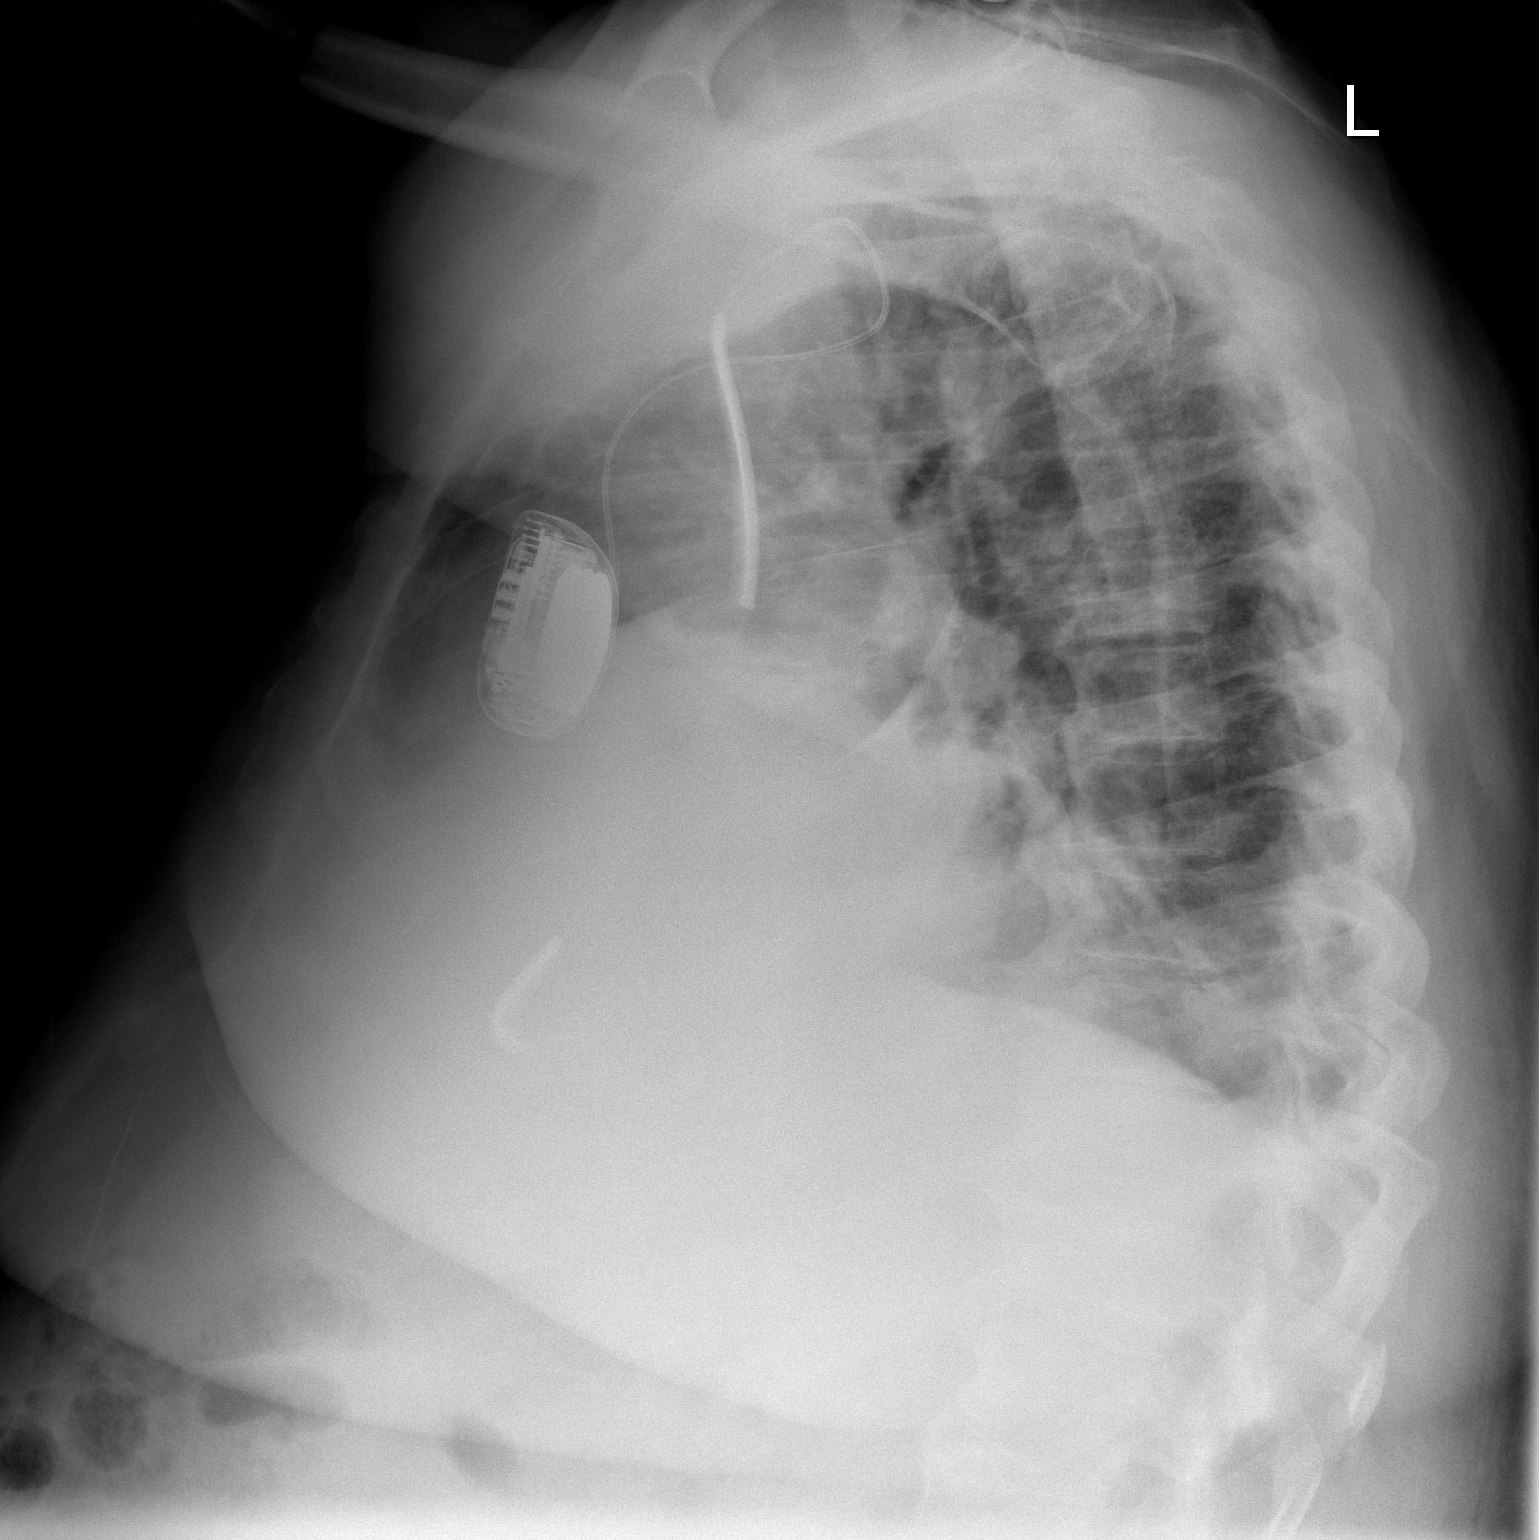

[2 of 2 positions shown; findings below may reference images not displayed]

FINDINGS: AICD in place. Heart size and pulmonary vascularity are normal.
Chronic elevation of the right hemidiaphragm with slight at
secondary atelectasis at the right lung base, unchanged. No acute
bone abnormality.
IMPRESSION: No active cardiopulmonary disease. Chronic elevation of the right
hemidiaphragm with secondary slight right base atelectasis.

## 2021-04-23 DIAGNOSIS — R942 Abnormal results of pulmonary function studies: Secondary | ICD-10-CM | POA: Insufficient documentation

## 2021-04-23 DIAGNOSIS — D509 Iron deficiency anemia, unspecified: Secondary | ICD-10-CM

## 2021-04-23 DIAGNOSIS — E538 Deficiency of other specified B group vitamins: Secondary | ICD-10-CM

## 2021-04-23 HISTORY — DX: Deficiency of other specified B group vitamins: E53.8

## 2021-04-23 HISTORY — DX: Iron deficiency anemia, unspecified: D50.9

## 2021-05-05 ENCOUNTER — Other Ambulatory Visit: Payer: Self-pay

## 2021-05-05 ENCOUNTER — Encounter (HOSPITAL_BASED_OUTPATIENT_CLINIC_OR_DEPARTMENT_OTHER): Payer: Self-pay | Admitting: *Deleted

## 2021-05-05 ENCOUNTER — Emergency Department (HOSPITAL_BASED_OUTPATIENT_CLINIC_OR_DEPARTMENT_OTHER)
Admission: EM | Admit: 2021-05-05 | Discharge: 2021-05-05 | Disposition: A | Discharge status: Home / Self Care | Payer: Medicare Other | Attending: Emergency Medicine | Admitting: Emergency Medicine

## 2021-05-05 DIAGNOSIS — L97919 Non-pressure chronic ulcer of unspecified part of right lower leg with unspecified severity: Secondary | ICD-10-CM | POA: Insufficient documentation

## 2021-05-05 DIAGNOSIS — Z7982 Long term (current) use of aspirin: Secondary | ICD-10-CM | POA: Diagnosis not present

## 2021-05-05 DIAGNOSIS — S8991XA Unspecified injury of right lower leg, initial encounter: Secondary | ICD-10-CM | POA: Diagnosis present

## 2021-05-05 DIAGNOSIS — W548XXA Other contact with dog, initial encounter: Secondary | ICD-10-CM | POA: Insufficient documentation

## 2021-05-05 DIAGNOSIS — I110 Hypertensive heart disease with heart failure: Secondary | ICD-10-CM | POA: Insufficient documentation

## 2021-05-05 DIAGNOSIS — S81811A Laceration without foreign body, right lower leg, initial encounter: Secondary | ICD-10-CM | POA: Diagnosis not present

## 2021-05-05 DIAGNOSIS — I83018 Varicose veins of right lower extremity with ulcer other part of lower leg: Secondary | ICD-10-CM | POA: Insufficient documentation

## 2021-05-05 DIAGNOSIS — I509 Heart failure, unspecified: Secondary | ICD-10-CM | POA: Diagnosis not present

## 2021-05-05 DIAGNOSIS — Z91040 Latex allergy status: Secondary | ICD-10-CM | POA: Diagnosis not present

## 2021-05-05 DIAGNOSIS — Z79899 Other long term (current) drug therapy: Secondary | ICD-10-CM | POA: Insufficient documentation

## 2021-05-05 DIAGNOSIS — Z21 Asymptomatic human immunodeficiency virus [HIV] infection status: Secondary | ICD-10-CM | POA: Diagnosis not present

## 2021-05-05 DIAGNOSIS — I83891 Varicose veins of right lower extremities with other complications: Secondary | ICD-10-CM

## 2021-05-05 NOTE — ED Triage Notes (Signed)
Leg injury. His dog scratched a varicose vein and caused a venous bleed. Bleeding is controlled with an ace bandage.

## 2021-05-05 NOTE — ED Provider Notes (Signed)
[facility] EMERGENCY DEPARTMENT Provider Note   CSN: [id number] Arrival date & time: 05/05/21  2052     History Chief Complaint  Patient presents with   Leg Injury    Leonard Mason is a 60 y.o. male.  The history is provided by the patient.  Laceration Location:  Leg Leg laceration location:  R upper leg Length:  0.5 cm Depth:  Cutaneous Quality comment:  Circular Bleeding: venous and controlled with pressure   Time since incident:  1 hour Injury mechanism: dog accidentally scratched his lower leg. Pain details:    Severity:  Moderate   Timing:  Constant   Progression:  Unchanged Foreign body present:  No foreign bodies Relieved by:  Pressure Worsened by:  Nothing Tetanus status:  Up to date Associated symptoms: no fever, no focal weakness, no numbness, no rash, no redness and no streaking       Past Medical History:  Diagnosis Date   A-fib (HCC)    CHF (congestive heart failure) (HCC)    GERD (gastroesophageal reflux disease)    Gout    HIV (human immunodeficiency virus infection) (HCC)    Hyperlipidemia    Hypertension    Vitamin D deficiency     There are no problems to display for this patient.   Past Surgical History:  Procedure Laterality Date   IMPLANTABLE CARDIOVERTER DEFIBRILLATOR IMPLANT     LAPAROSCOPIC GASTRIC SLEEVE RESECTION     PANNICULECTOMY         No family history on file.  Social History   Tobacco Use   Smoking status: Never   Smokeless tobacco: Never  Vaping Use   Vaping Use: Never used  Substance Use Topics   Alcohol use: Never   Drug use: Never    Home Medications Prior to Admission medications   Medication Sig Start Date End Date Taking? Authorizing Provider  albuterol (PROVENTIL HFA;VENTOLIN HFA) 108 (90 Base) MCG/ACT inhaler INHALE 2 PUFFS INTO THE LUNGS EVERY 6 HOURS AS NEEDED FOR WHEEZING 01/06/18   [provider]  allopurinol (ZYLOPRIM) 300 MG tablet Take by mouth. 08/10/18   [provider]  aspirin 81 MG chewable tablet Chew by mouth. 11/04/14   [provider]  bictegravir-emtricitabine-tenofovir AF (BIKTARVY) 50-200-25 MG TABS tablet TAKE 1 TABLET BY MOUTH DAILY. 01/21/17   [provider]  carvedilol (COREG) 12.5 MG tablet Take by mouth. 11/29/17 11/29/18  [provider]  cephALEXin (KEFLEX) 500 MG capsule Take 1 capsule (500 mg total) by mouth 4 (four) times daily. 01/22/18   Ward, Chase Picket, PA-C  Cholecalciferol (VITAMIN D) 2000 units tablet Take by mouth. 01/17/18   [provider]  colchicine 0.6 MG tablet Take 2 tablets at onset of gout pain, can take a 3rd tablet if needed but no more than 3 tablets in 24 hours. 08/10/18   [provider]  Cyanocobalamin (VITAMIN B-12) 2000 MCG TBCR Take by mouth.    [provider]  doxycycline (VIBRAMYCIN) 100 MG capsule Take 1 capsule (100 mg total) by mouth 2 (two) times daily. One po bid x 7 days 09/03/18   Doug Sou, MD  esomeprazole (NEXIUM) 40 MG capsule TAKE 1 CAPSULE BY MOUTH DAILY 03/21/15   [provider]  furosemide (LASIX) 80 MG tablet Take by mouth. 01/21/16   [provider]  ketoconazole (NIZORAL) 2 % shampoo APPLY EXTERNALLY TO THE SKIN DAILY. LEAVE ON FOR 3 TO 5 MINUTES THEN WASH OFF. AVOID EYES 02/19/15  [provider]  losartan (COZAAR) 50 MG tablet Take 50 mg by mouth daily.    [provider]  losartan-hydrochlorothiazide Mauri Reading) 100-12.5 MG tablet Take by mouth. 03/21/15   [provider]  metFORMIN (GLUCOPHAGE) 1000 MG tablet Take by mouth. 02/19/15   [provider]  mometasone (ASMANEX) 220 MCG/INH inhaler Inhale into the lungs. 01/05/18 01/05/19  [provider]  potassium chloride SA (K-DUR,KLOR-CON) 20 MEQ tablet Take by mouth.    [provider]  pravastatin (PRAVACHOL) 80 MG tablet TK 1 T PO DAILY QHS 05/15/18   [provider]  Psyllium (METAMUCIL) 48.57 % POWD Use  as directed 06/14/18   [provider]  traMADol (ULTRAM) 50 MG tablet Take 1 tablet (50 mg total) by mouth every 6 (six) hours as needed. 01/22/18   Ward, Chase Picket, PA-C    Allergies    Niacin and Latex  Review of Systems   Review of Systems  Constitutional:  Negative for chills and fever.  HENT:  Negative for ear pain and sore throat.   Eyes:  Negative for pain and visual disturbance.  Respiratory:  Negative for cough and shortness of breath.   Cardiovascular:  Negative for chest pain and palpitations.  Gastrointestinal:  Negative for abdominal pain and vomiting.  Genitourinary:  Negative for dysuria and hematuria.  Musculoskeletal:  Negative for arthralgias and back pain.  Skin:  Negative for color change and rash.  Neurological:  Negative for focal weakness, seizures and syncope.  All other systems reviewed and are negative.  Physical Exam Updated Vital Signs BP 139/68 (BP Location: Left Arm)   Pulse (!) 57   Temp 98.4 F (36.9 C) (Oral)   Resp 20   Ht 5\' 11"  (1.803 m)   Wt (!) 141.5 kg   SpO2 96%   BMI 43.52 kg/m   Physical Exam Vitals and nursing note reviewed.  Constitutional:      Appearance: Normal appearance.  HENT:     Head: Normocephalic and atraumatic.  Eyes:     Conjunctiva/sclera: Conjunctivae normal.  Pulmonary:     Effort: Pulmonary effort is normal. No respiratory distress.  Musculoskeletal:        General: No deformity. Normal range of motion.     Cervical back: Normal range of motion.  Skin:    General: Skin is warm and dry.     Comments: Steady bleeding from a small varicose vein at the medial aspect of the right distal thigh just above the tibial plateau  Neurological:     General: No focal deficit present.     Mental Status: He is alert and oriented to person, place, and time. Mental status is at baseline.  Psychiatric:        Mood and Affect: Mood normal.    ED Results / Procedures / Treatments   Labs (all labs ordered are  listed, but only abnormal results are displayed) Labs Reviewed - No data to display  EKG None  Radiology No results found.  Procedures . Laceration Repair  Date/Time: 05/05/2021 10:15 PM Performed by: 05/07/2021, MD Authorized by: Koleen Distance, MD   Consent:    Consent obtained:  Verbal   Consent given by:  Patient   Risks, benefits, and alternatives were discussed: yes     Risks discussed:  Infection, pain, need for additional repair and vascular damage   Alternatives discussed:  No treatment, delayed treatment, observation and referral Universal protocol:    Procedure explained and questions  answered to patient or proxy's satisfaction: yes     Immediately prior to procedure, a time out was called: yes     Patient identity confirmed:  Verbally with patient Laceration details:    Length (cm):  0.3   Depth (mm):  1 Exploration:    Hemostasis achieved with:  Tourniquet   Wound exploration: wound explored through full range of motion and entire depth of wound visualized     Wound extent: no foreign bodies/material noted and no underlying fracture noted     Contaminated: no   Treatment:    Visualized foreign bodies/material removed: no   Skin repair:    Repair method:  Tissue adhesive Approximation:    Approximation:  Close Repair type:    Repair type:  Simple Post-procedure details:    Dressing: compression dressing.   Procedure completion:  Tolerated well, no immediate complications   Medications Ordered in ED Medications - No data to display  ED Course  I have reviewed the triage vital signs and the nursing notes.  Pertinent labs & imaging results that were available during my care of the patient were reviewed by me and considered in my medical decision making (see chart for details).    MDM Rules/Calculators/A&P                          Gayle Guerry Bruin presented with bleeding from a varicose vein.  Tourniquet was applied to the distal aspect of the bleeding  vein.  Bleeding was stopped by using Dermabond. 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

## 2021-05-05 NOTE — Discharge Instructions (Addendum)
Leave dressing in place for 24 hours prior to changing it. Keep it clean and dry until it heals.

## 2021-07-02 DIAGNOSIS — Z8679 Personal history of other diseases of the circulatory system: Secondary | ICD-10-CM

## 2021-07-02 HISTORY — DX: Personal history of other diseases of the circulatory system: Z86.79

## 2021-08-10 DIAGNOSIS — L910 Hypertrophic scar: Secondary | ICD-10-CM

## 2021-08-10 HISTORY — DX: Hypertrophic scar: L91.0

## 2022-03-03 DIAGNOSIS — I700 Atherosclerosis of aorta: Secondary | ICD-10-CM | POA: Insufficient documentation

## 2023-08-21 ENCOUNTER — Emergency Department (HOSPITAL_BASED_OUTPATIENT_CLINIC_OR_DEPARTMENT_OTHER): Payer: 59

## 2023-08-21 ENCOUNTER — Emergency Department (HOSPITAL_BASED_OUTPATIENT_CLINIC_OR_DEPARTMENT_OTHER)
Admission: EM | Admit: 2023-08-21 | Discharge: 2023-08-21 | Disposition: A | Discharge status: Home / Self Care | Payer: 59 | Attending: Emergency Medicine | Admitting: Emergency Medicine

## 2023-08-21 ENCOUNTER — Other Ambulatory Visit: Payer: Self-pay

## 2023-08-21 ENCOUNTER — Encounter (HOSPITAL_BASED_OUTPATIENT_CLINIC_OR_DEPARTMENT_OTHER): Payer: Self-pay | Admitting: Urology

## 2023-08-21 DIAGNOSIS — R001 Bradycardia, unspecified: Secondary | ICD-10-CM | POA: Insufficient documentation

## 2023-08-21 DIAGNOSIS — Z95810 Presence of automatic (implantable) cardiac defibrillator: Secondary | ICD-10-CM | POA: Diagnosis not present

## 2023-08-21 DIAGNOSIS — Z7982 Long term (current) use of aspirin: Secondary | ICD-10-CM | POA: Insufficient documentation

## 2023-08-21 DIAGNOSIS — Z21 Asymptomatic human immunodeficiency virus [HIV] infection status: Secondary | ICD-10-CM | POA: Diagnosis not present

## 2023-08-21 DIAGNOSIS — Z91040 Latex allergy status: Secondary | ICD-10-CM | POA: Insufficient documentation

## 2023-08-21 DIAGNOSIS — Y92410 Unspecified street and highway as the place of occurrence of the external cause: Secondary | ICD-10-CM | POA: Diagnosis not present

## 2023-08-21 DIAGNOSIS — Z9884 Bariatric surgery status: Secondary | ICD-10-CM | POA: Diagnosis not present

## 2023-08-21 DIAGNOSIS — N289 Disorder of kidney and ureter, unspecified: Secondary | ICD-10-CM | POA: Diagnosis not present

## 2023-08-21 DIAGNOSIS — I130 Hypertensive heart and chronic kidney disease with heart failure and stage 1 through stage 4 chronic kidney disease, or unspecified chronic kidney disease: Secondary | ICD-10-CM | POA: Diagnosis not present

## 2023-08-21 DIAGNOSIS — M25571 Pain in right ankle and joints of right foot: Secondary | ICD-10-CM | POA: Insufficient documentation

## 2023-08-21 DIAGNOSIS — I110 Hypertensive heart disease with heart failure: Secondary | ICD-10-CM | POA: Insufficient documentation

## 2023-08-21 DIAGNOSIS — Z79899 Other long term (current) drug therapy: Secondary | ICD-10-CM | POA: Insufficient documentation

## 2023-08-21 DIAGNOSIS — M25572 Pain in left ankle and joints of left foot: Secondary | ICD-10-CM | POA: Insufficient documentation

## 2023-08-21 DIAGNOSIS — S5012XA Contusion of left forearm, initial encounter: Secondary | ICD-10-CM | POA: Insufficient documentation

## 2023-08-21 DIAGNOSIS — R0789 Other chest pain: Secondary | ICD-10-CM | POA: Insufficient documentation

## 2023-08-21 DIAGNOSIS — M79632 Pain in left forearm: Secondary | ICD-10-CM | POA: Diagnosis present

## 2023-08-21 DIAGNOSIS — K8020 Calculus of gallbladder without cholecystitis without obstruction: Secondary | ICD-10-CM | POA: Insufficient documentation

## 2023-08-21 DIAGNOSIS — I509 Heart failure, unspecified: Secondary | ICD-10-CM | POA: Insufficient documentation

## 2023-08-21 LAB — BASIC METABOLIC PANEL
Anion gap: 13 (ref 5–15)
BUN: 38 mg/dL — ABNORMAL HIGH (ref 8–23)
CO2: 21 mmol/L — ABNORMAL LOW (ref 22–32)
Calcium: 8.8 mg/dL — ABNORMAL LOW (ref 8.9–10.3)
Chloride: 105 mmol/L (ref 98–111)
Creatinine, Ser: 2.2 mg/dL — ABNORMAL HIGH (ref 0.61–1.24)
GFR, Estimated: 33 mL/min — ABNORMAL LOW (ref >60)
Glucose, Bld: 131 mg/dL — ABNORMAL HIGH (ref 70–99)
Potassium: 3.5 mmol/L (ref 3.5–5.1)
Sodium: 139 mmol/L (ref 135–145)

## 2023-08-21 LAB — CBC
HCT: 55.1 % — ABNORMAL HIGH (ref 39.0–52.0)
Hemoglobin: 17.6 g/dL — ABNORMAL HIGH (ref 13.0–17.0)
MCH: 30.5 pg (ref 26.0–34.0)
MCHC: 31.9 g/dL (ref 30.0–36.0)
MCV: 95.5 fL (ref 80.0–100.0)
Platelets: 150 10*3/uL (ref 150–400)
RBC: 5.77 MIL/uL (ref 4.22–5.81)
RDW: 12.9 % (ref 11.5–15.5)
WBC: 4 10*3/uL (ref 4.0–10.5)
nRBC: 0 % (ref 0.0–0.2)
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
## 2023-08-21 NOTE — Discharge Instructions (Signed)
X-rays and CT scans did not show any signs of broken bones or serious injuries.  Expect to be stiff and sore for the next several days.  Take over-the-counter medications such as Tylenol help with your aches and pains.

## 2023-08-21 NOTE — ED Notes (Signed)
Pt takes xarelto, has pacemaker defibrillator, HR runs low in 50s per pt

## 2024-07-17 LAB — LAB REPORT - SCANNED
EGFR: 52
EGFR: 63

## 2024-09-24 ENCOUNTER — Encounter: Payer: Self-pay | Admitting: Cardiology

## 2024-09-24 ENCOUNTER — Encounter: Payer: Self-pay | Admitting: *Deleted

## 2024-09-24 ENCOUNTER — Ambulatory Visit: Attending: Cardiology | Admitting: Cardiology

## 2024-09-24 VITALS — BP 102/80 | HR 75 | Ht 71.0 in | Wt 252.0 lb

## 2024-09-24 DIAGNOSIS — I5042 Chronic combined systolic (congestive) and diastolic (congestive) heart failure: Secondary | ICD-10-CM | POA: Diagnosis not present

## 2024-09-24 DIAGNOSIS — K219 Gastro-esophageal reflux disease without esophagitis: Secondary | ICD-10-CM | POA: Insufficient documentation

## 2024-09-24 DIAGNOSIS — J45909 Unspecified asthma, uncomplicated: Secondary | ICD-10-CM

## 2024-09-24 DIAGNOSIS — R0609 Other forms of dyspnea: Secondary | ICD-10-CM

## 2024-09-24 DIAGNOSIS — I420 Dilated cardiomyopathy: Secondary | ICD-10-CM | POA: Diagnosis not present

## 2024-09-24 DIAGNOSIS — N1831 Chronic kidney disease, stage 3a: Secondary | ICD-10-CM

## 2024-09-24 DIAGNOSIS — R5383 Other fatigue: Secondary | ICD-10-CM

## 2024-09-24 DIAGNOSIS — M25473 Effusion, unspecified ankle: Secondary | ICD-10-CM | POA: Insufficient documentation

## 2024-09-24 DIAGNOSIS — Z9889 Other specified postprocedural states: Secondary | ICD-10-CM

## 2024-09-24 DIAGNOSIS — Z8679 Personal history of other diseases of the circulatory system: Secondary | ICD-10-CM

## 2024-09-24 DIAGNOSIS — I10 Essential (primary) hypertension: Secondary | ICD-10-CM | POA: Diagnosis not present

## 2024-09-24 DIAGNOSIS — E785 Hyperlipidemia, unspecified: Secondary | ICD-10-CM | POA: Insufficient documentation

## 2024-09-24 DIAGNOSIS — L219 Seborrheic dermatitis, unspecified: Secondary | ICD-10-CM

## 2024-09-24 DIAGNOSIS — Z95810 Presence of automatic (implantable) cardiac defibrillator: Secondary | ICD-10-CM

## 2024-09-24 DIAGNOSIS — E7800 Pure hypercholesterolemia, unspecified: Secondary | ICD-10-CM

## 2024-09-24 DIAGNOSIS — Z9884 Bariatric surgery status: Secondary | ICD-10-CM

## 2024-09-24 HISTORY — DX: Unspecified asthma, uncomplicated: J45.909

## 2024-09-24 HISTORY — DX: Seborrheic dermatitis, unspecified: L21.9

## 2024-09-24 NOTE — Progress Notes (Signed)
 Cardiology Consultation:    Date:  09/24/2024   ID:  Leonard Mason, DOB 23-May-1961, MRN [id number]  PCP:  [facility]  Cardiologist:  Lamar Fitch, MD   Referring MD: [facility], [facility]   No chief complaint on file. Like to be established with the practice  History of Present Illness:    Leonard Mason is a 63 y.o. male who is being seen today for the evaluation of cardiomyopathy at the request of [facility], [facility].  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  Recently he saw a team from [facility] and conversation about potential transplant has been initiated he would like to be reestablished with our clinic.  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  Past Medical History:  Diagnosis Date   A-fib (HCC)    CHF (congestive heart failure) (HCC)    GERD (gastroesophageal reflux disease)    Gout    HIV (human immunodeficiency virus infection) (HCC)    Hyperlipidemia    Hypertension    Vitamin D deficiency     Past Surgical History:  Procedure Laterality Date   IMPLANTABLE CARDIOVERTER DEFIBRILLATOR IMPLANT     LAPAROSCOPIC GASTRIC SLEEVE RESECTION     PANNICULECTOMY      Current Medications: Current Meds  Medication Sig   albuterol (PROVENTIL HFA;VENTOLIN HFA) 108 (90 Base) MCG/ACT inhaler INHALE 2 PUFFS INTO THE LUNGS EVERY 6 HOURS AS NEEDED FOR WHEEZING   allopurinol (ZYLOPRIM) 100 MG tablet Take 100 mg by mouth daily.   ascorbic acid (VITAMIN C) 1000 MG tablet Take 1,000 mg by mouth daily.   aspirin 81 MG chewable tablet Chew by mouth.   atorvastatin (LIPITOR) 20 MG tablet Take 20 mg by mouth daily.   bictegravir-emtricitabine-tenofovir AF (BIKTARVY) 50-200-25 MG TABS tablet TAKE 1 TABLET BY MOUTH DAILY.   Cholecalciferol (VITAMIN D) 2000 units tablet Take by mouth.   colchicine 0.6 MG tablet Take 2 tablets at onset of gout pain, can take a 3rd tablet if needed but no more than 3 tablets in 24 hours.   Cyanocobalamin (VITAMIN B-12) 2000 MCG TBCR Take by mouth.   ENTRESTO 49-51 MG Take 1 tablet by mouth 2 (two) times daily.  eplerenone (INSPRA) 50 MG tablet Take 50 mg by mouth daily.   esomeprazole (NEXIUM) 40 MG capsule TAKE 1 CAPSULE BY MOUTH DAILY   FARXIGA 10 MG TABS tablet Take 10 mg by mouth daily.   ketoconazole (NIZORAL) 2 % shampoo APPLY EXTERNALLY TO THE SKIN DAILY. LEAVE ON FOR 3 TO 5 MINUTES THEN WASH OFF. AVOID EYES   LINZESS 290 MCG CAPS capsule Take 290 mcg by mouth daily.   loratadine (CLARITIN) 10 MG tablet Take 10 mg by mouth daily.   metFORMIN (GLUCOPHAGE) 1000 MG tablet Take  by mouth.   metoprolol succinate (TOPROL-XL) 25 MG 24 hr tablet Take 25 mg by mouth at bedtime.   mometasone (ASMANEX) 220 MCG/INH inhaler Inhale into the lungs.   OZEMPIC, 2 MG/DOSE, 8 MG/3ML SOPN Inject 2 mg into the skin once a week.   potassium chloride  SA (K-DUR,KLOR-CON ) 20 MEQ tablet Take by mouth.   Psyllium (METAMUCIL) 48.57 % POWD Use as directed   Rivaroxaban (XARELTO) 15 MG TABS tablet Take 15 mg by mouth daily with supper.   torsemide (DEMADEX) 20 MG tablet Take 20 mg by mouth 3 (three) times a week.   traMADol  (ULTRAM ) 50 MG tablet Take 1 tablet (50 mg total) by mouth every 6 (six) hours as needed.     Allergies:   Niacin, Latex, and Spironolactone   Social History   Socioeconomic History   Marital status: Single    Spouse name: Not on file   Number of children: Not on file   Years of education: Not on file   Highest education level: Not on file  Occupational History   Not on file  Tobacco Use   Smoking status: Never   Smokeless tobacco: Never  Vaping Use   Vaping status: Never Used  Substance and Sexual Activity   Alcohol use: Never   Drug use: Never   Sexual activity: Not on file  Other Topics Concern   Not on file  Social History Narrative   Not on file   Social Drivers of Health   Financial Resource Strain: Not on file  Food Insecurity: No Food Insecurity (11/30/2018)   Received from [facility] visits prior to 01/22/2023.   Hunger Vital Sign    Worried About Running Out of Food in the Last Year: Never true    Ran Out of Food in the Last Year: Never true  Transportation Needs: No Transportation Needs (11/30/2018)   Received from [facility] visits prior to 01/22/2023.   PRAPARE - Administrator, Civil Service (Medical): No    Lack of Transportation (Non-Medical): No  Physical Activity: Not on file  Stress: Not on file  Social Connections: Not on file     Family History: The patient's family  history is not on file. ROS:   Please see the history of present illness.    All 14 point review of systems negative except as described per history of present illness.  EKGs/Labs/Other Studies Reviewed:    The following studies were reviewed today:   EKG:       Recent Labs: No results found for requested labs within last 365 days.  Recent Lipid Panel No results found for: CHOL, TRIG, HDL, CHOLHDL, VLDL, LDLCALC, LDLDIRECT  Physical Exam:    VS:  BP 102/80   Pulse 75   Ht 5' 11 (1.803 m)   Wt 252 lb (114.3 kg)   SpO2 97%   BMI 35.15 kg/m  Wt Readings from Last 3 Encounters:  09/24/24 252 lb (114.3 kg)  08/21/23 (!) 311 lb 15.2 oz (141.5 kg)  05/05/21 (!) 312 lb (141.5 kg)     GEN:  Well nourished, well developed in no acute distress HEENT: Normal NECK: No JVD; No carotid bruits LYMPHATICS: No lymphadenopathy CARDIAC: RRR, no murmurs, no rubs, no gallops RESPIRATORY:  Clear to auscultation without rales, wheezing or rhonchi  ABDOMEN: Soft, non-tender, non-distended MUSCULOSKELETAL:  No edema; No deformity  SKIN: Warm and dry NEUROLOGIC:  Alert and oriented x 3 PSYCHIATRIC:  Normal affect   ASSESSMENT:    1. Essential hypertension   2. Dilated cardiomyopathy (HCC)   3. Chronic combined systolic and diastolic congestive heart failure (HCC)   4. ICD (implantable cardioverter-defibrillator) in place   5. Hypercholesteremia   6. S/P bariatric surgery   7. S/P ablation of atrial flutter   8. Stage 3a chronic kidney disease (HCC)    PLAN:    In order of problems listed above:  Dilated cardiomyopathy excellently managed by team from [facility].  He does not know exactly what medication he takes.  Will call him tomorrow and get all medication he is taking.  He looks compensated on physical exam.  Obviously we will try to augment medication for.  I will check Chem-7 and proBNP today.  He will be eventually referred to our advanced congestive heart  failure clinic.  He is [place]  [employer] is 2. Essential hypertension blood pressure well-controlled continue present management. ICD present is a Medtronic device we will enroll him to our EP clinic for follow-up. Hypercholesterolemia, again will call him later today to find out medication he is on. S/p atrial flutter ablation no recurrences of arrhythmia continue Xarelto. Chronic kidney failure will check his kidney function.  Overall he looks compensated and he is happy the way he is.  There is discussion about transplant has been initiated but he is kind of reluctant to even consider that we will continue this discussion.  He will be eventually referred to our advanced congestive heart failure clinic in the meantime we will get echocardiogram to assess left ventricle ejection fraction   Medication Adjustments/Labs and Tests Ordered: Current medicines are reviewed at length with the patient today.  Concerns regarding medicines are outlined above.  Orders Placed This Encounter  Procedures   EKG 12-Lead   No orders of the defined types were placed in this encounter.   Signed, Lamar DOROTHA Fitch, MD, [facility]. 09/24/2024 11:04 AM     [facility]

## 2024-09-24 NOTE — Patient Instructions (Signed)
 Medication Instructions:  Your physician recommends that you continue on your current medications as directed. Please refer to the Current Medication list given to you today.  *If you need a refill on your cardiac medications before your next appointment, please call your pharmacy*   Lab Work: 3rd Floor   [address]  CMP, TSH   Testing/Procedures: Your physician has requested that you have an echocardiogram. Echocardiography is a painless test that uses sound waves to create images of your heart. It provides your doctor with information about the size and shape of your heart and how well your heart's chambers and valves are working. This procedure takes approximately one hour. There are no restrictions for this procedure. Please do NOT wear cologne, perfume, aftershave, or lotions (deodorant is allowed). Please arrive 15 minutes prior to your appointment time.  Please note: We ask at that you not bring children with you during ultrasound (echo/ vascular) testing. Due to room size and safety concerns, children are not allowed in the ultrasound rooms during exams. Our front office staff cannot provide observation of children in our lobby area while testing is being conducted. An adult accompanying a patient to their appointment will only be allowed in the ultrasound room at the discretion of the ultrasound technician under special circumstances. We apologize for any inconvenience.    Follow-Up: At [facility], you and your health needs are our priority.  As part of our continuing mission to provide you with exceptional heart care, we have created designated Provider Care Teams.  These Care Teams include your primary Cardiologist (physician) and Advanced Practice Providers (APPs -  Physician Assistants and Nurse Practitioners) who all work together to provide you with the care you need, when you need it.  We recommend signing up for the patient portal called MyChart.  Sign up information is  provided on this After Visit Summary.  MyChart is used to connect with patients for Virtual Visits (Telemedicine).  Patients are able to view lab/test results, encounter notes, upcoming appointments, etc.  Non-urgent messages can be sent to your provider as well.   To learn more about what you can do with MyChart, go to [email or web address].    Your next appointment:   6 week(s)  The format for your next appointment:   In Person  Provider:   Lamar Fitch, MD    Other Instructions Referral to Dr. Inocencio

## 2024-09-24 NOTE — Addendum Note (Signed)
 Addended by: ARLOA PLANAS D on: 09/24/2024 11:20 AM   Modules accepted: Orders

## 2024-09-25 LAB — COMPREHENSIVE METABOLIC PANEL WITH GFR
ALT: 12 IU/L (ref 0–44)
AST: 15 IU/L (ref 0–40)
Albumin: 4.2 g/dL (ref 3.9–4.9)
Alkaline Phosphatase: 96 IU/L (ref 47–123)
BUN/Creatinine Ratio: 16 (ref 10–24)
BUN: 30 mg/dL — ABNORMAL HIGH (ref 8–27)
Bilirubin Total: 0.6 mg/dL (ref 0.0–1.2)
CO2: 23 mmol/L (ref 20–29)
Calcium: 9.4 mg/dL (ref 8.6–10.2)
Chloride: 106 mmol/L (ref 96–106)
Creatinine, Ser: 1.86 mg/dL — ABNORMAL HIGH (ref 0.76–1.27)
Globulin, Total: 2.7 g/dL (ref 1.5–4.5)
Glucose: 89 mg/dL (ref 70–99)
Potassium: 3.7 mmol/L (ref 3.5–5.2)
Sodium: 144 mmol/L (ref 134–144)
Total Protein: 6.9 g/dL (ref 6.0–8.5)
eGFR: 40 mL/min/1.73 — ABNORMAL LOW (ref >59)

## 2024-09-25 LAB — TSH: TSH: 1.49 u[IU]/mL (ref 0.450–4.500)

## 2024-10-01 ENCOUNTER — Ambulatory Visit: Payer: Self-pay | Admitting: Cardiology

## 2024-10-08 ENCOUNTER — Ambulatory Visit: Attending: Cardiology | Admitting: Cardiology

## 2024-10-08 ENCOUNTER — Encounter: Payer: Self-pay | Admitting: Cardiology

## 2024-10-08 VITALS — BP 110/72 | HR 70 | Ht 71.0 in | Wt 257.0 lb

## 2024-10-08 DIAGNOSIS — I10 Essential (primary) hypertension: Secondary | ICD-10-CM

## 2024-10-08 DIAGNOSIS — I484 Atypical atrial flutter: Secondary | ICD-10-CM | POA: Diagnosis not present

## 2024-10-08 DIAGNOSIS — I5022 Chronic systolic (congestive) heart failure: Secondary | ICD-10-CM

## 2024-10-08 DIAGNOSIS — I472 Ventricular tachycardia, unspecified: Secondary | ICD-10-CM | POA: Diagnosis not present

## 2024-10-08 LAB — CUP PACEART INCLINIC DEVICE CHECK
Date Time Interrogation Session: 20251117160612
Implantable Lead Connection Status: 753985
Implantable Lead Implant Date: 20160620
Implantable Lead Location: 753860
Implantable Pulse Generator Implant Date: 20160620

## 2024-10-08 NOTE — Progress Notes (Signed)
  Electrophysiology Office Note:   Date:  10/08/2024  ID:  Levander [name], DOB 06-05-1961, MRN [id number]  Primary Cardiologist: Lamar Fitch, MD Primary Heart Failure: None Electrophysiologist: [place] Gladis Norton, MD      History of Present Illness:   Leonard Mason is a 63 y.o. male with h/o chronic systolic heart failure due to nonischemic cardiomyopathy, HIV, atrial flutter, PVCs, diabetes, hypertension, hyperlipidemia, sleep apnea, obesity post gastric sleeve, CKD seen today for  for Electrophysiology evaluation of chronic systolic heart failure, ICD at the request of Lamar Fitch.    Currently he feels well.  He has no chest pain or shortness of breath.  He is able to do his daily activities.  He has received 1 ICD shock in the past.  This occurred in February at his family member's funeral.  Aside from that, he has no acute complaints.  He continues to be active.  He is switching to our practice from [facility].  [facility] was told at [facility] that he would need a transplant, and thus he elected to switch.  Review of systems complete and found to be negative unless listed in HPI.      EP Information / Studies Reviewed:    EKG is not ordered today. EKG from 09/24/2024 reviewed which showed sinus rhythm, PVCs, Lateral T wave inversions      ICD Interrogation-  reviewed in detail today,  See PACEART report.  Device History: Medtronic Single Chamber ICD implanted 2016 for chronic systolic heart failure History of appropriate therapy: Yes History of AAD therapy: No   Risk Assessment/Calculations:    CHA2DS2-VASc Score = 2   This indicates a 2.2% annual risk of stroke. The patient's score is based upon: CHF History: 1 HTN History: 1 Diabetes History: 0 Stroke History: 0 Vascular Disease History: 0 Age Score: 0 Gender Score: 0            Physical Exam:   VS:  BP 110/72 (BP Location: Left Arm, Patient Position: Sitting, Cuff Size: Large)   Pulse 70   Ht 5' 11 (1.803 m)   Wt 257  lb (116.6 kg)   SpO2 96%   BMI 35.84 kg/m    Wt Readings from Last 3 Encounters:  10/08/24 257 lb (116.6 kg)  09/24/24 252 lb (114.3 kg)  08/21/23 (!) 311 lb 15.2 oz (141.5 kg)     GEN: Well nourished, well developed in no acute distress NECK: No JVD; No carotid bruits CARDIAC: Regular rate and rhythm, no murmurs, rubs, gallops RESPIRATORY:  Clear to auscultation without rales, wheezing or rhonchi  ABDOMEN: Soft, non-tender, non-distended EXTREMITIES:  No edema; No deformity   ASSESSMENT AND PLAN:    Chronic systolic dysfunction s/p Medtronic single chamber ICD  euvolemic today Stable on an appropriate medical regimen Normal ICD function See Pace Art report No changes today  2.  Hypertension: Well-controlled  3.  Atypical atrial flutter: Post ablation 07/02/2021.  Remains on Xarelto.  4.  CKD stage IIIa: Plan per primary care  5.  Ventricular tachycardia: Post ICD shock 02/14/2024.  With switch to Toprol-XL from carvedilol.  Disposition:   Follow up with EP Team in 12 months   Signed, [place] Gladis Norton, MD

## 2024-10-10 ENCOUNTER — Ambulatory Visit (HOSPITAL_BASED_OUTPATIENT_CLINIC_OR_DEPARTMENT_OTHER)
Admission: RE | Admit: 2024-10-10 | Discharge: 2024-10-10 | Disposition: A | Discharge status: Home / Self Care | Source: Ambulatory Visit | Attending: Cardiology | Admitting: Cardiology

## 2024-10-10 ENCOUNTER — Ambulatory Visit: Payer: Self-pay | Admitting: Cardiology

## 2024-10-10 DIAGNOSIS — R0609 Other forms of dyspnea: Secondary | ICD-10-CM | POA: Insufficient documentation

## 2024-10-10 LAB — ECHOCARDIOGRAM COMPLETE
AR max vel: 1.77 cm2
AV Area VTI: 1.75 cm2
AV Area mean vel: 1.83 cm2
AV Mean grad: 3 mmHg
AV Peak grad: 5.3 mmHg
AV Vena cont: 0.2 cm
Ao pk vel: 1.16 m/s
Area-P 1/2: 5.13 cm2
Calc EF: 25.9 %
Est EF: <20
MV M vel: 5.09 m/s
MV Peak grad: 103.6 mmHg
MV VTI: 0.2 cm2
Radius: 0.4 cm
S' Lateral: 6.5 cm
Single Plane A2C EF: 25.8 %
Single Plane A4C EF: 25.8 %

## 2024-10-24 DIAGNOSIS — M109 Gout, unspecified: Secondary | ICD-10-CM | POA: Insufficient documentation

## 2024-10-24 DIAGNOSIS — I4891 Unspecified atrial fibrillation: Secondary | ICD-10-CM | POA: Insufficient documentation

## 2024-10-25 ENCOUNTER — Encounter: Payer: Self-pay | Admitting: Cardiology

## 2024-10-25 ENCOUNTER — Ambulatory Visit: Attending: Cardiology | Admitting: Cardiology

## 2024-10-25 VITALS — BP 110/80 | HR 88 | Ht 71.0 in | Wt 263.8 lb

## 2024-10-25 DIAGNOSIS — Z95810 Presence of automatic (implantable) cardiac defibrillator: Secondary | ICD-10-CM

## 2024-10-25 DIAGNOSIS — I420 Dilated cardiomyopathy: Secondary | ICD-10-CM

## 2024-10-25 DIAGNOSIS — I10 Essential (primary) hypertension: Secondary | ICD-10-CM | POA: Diagnosis not present

## 2024-10-25 DIAGNOSIS — I5022 Chronic systolic (congestive) heart failure: Secondary | ICD-10-CM

## 2024-10-25 DIAGNOSIS — E7800 Pure hypercholesterolemia, unspecified: Secondary | ICD-10-CM

## 2024-10-25 DIAGNOSIS — Z9884 Bariatric surgery status: Secondary | ICD-10-CM

## 2024-10-25 MED ORDER — METOPROLOL SUCCINATE ER 50 MG PO TB24: 50.0000 mg | ORAL_TABLET | Freq: Every day | ORAL | 3 refills | Status: AC

## 2024-10-25 NOTE — Patient Instructions (Addendum)
 Medication Instructions:   START: Metoprolol  Succinate 50mg  daily   Lab Work: None Ordered If you have labs (blood work) drawn today and your tests are completely normal, you will receive your results only by: MyChart Message (if you have MyChart) OR A paper copy in the mail If you have any lab test that is abnormal or we need to change your treatment, we will call you to review the results.   Testing/Procedures: None Ordered   Follow-Up: At [facility], you and your health needs are our priority.  As part of our continuing mission to provide you with exceptional heart care, we have created designated Provider Care Teams.  These Care Teams include your primary Cardiologist (physician) and Advanced Practice Providers (APPs -  Physician Assistants and Nurse Practitioners) who all work together to provide you with the care you need, when you need it.  We recommend signing up for the patient portal called MyChart.  Sign up information is provided on this After Visit Summary.  MyChart is used to connect with patients for Virtual Visits (Telemedicine).  Patients are able to view lab/test results, encounter notes, upcoming appointments, etc.  Non-urgent messages can be sent to your provider as well.   To learn more about what you can do with MyChart, go to [email or web address].    Your next appointment:   5 month(s)  The format for your next appointment:   In Person  Provider:   Lamar Fitch, MD    Other Instructions Advanced CHF clinic- referral- they will call for appt

## 2024-10-25 NOTE — Addendum Note (Signed)
 Addended by: ARLOA PLANAS D on: 10/25/2024 10:38 AM   Modules accepted: Orders

## 2024-10-25 NOTE — Addendum Note (Signed)
 Addended by: ARLOA PLANAS D on: 10/25/2024 11:04 AM   Modules accepted: Orders

## 2024-10-25 NOTE — Progress Notes (Signed)
 Cardiology Office Note:    Date:  10/25/2024   ID:  Leonard Mason, DOB [birth year], MRN [id number]  PCP:  [facility]  Cardiologist:  Lamar Fitch, MD    Referring MD: [facility]   No chief complaint on file.   History of Present Illness:    Leonard Mason is a 63 y.o. male  Past medical history significant for HIV positive well-controlled, nonischemic cardiomyopathy with ejection fraction of 15% last echocardiogram at the beginning of this year, initial diagnosis 15 years ago, he is TSH is fine ANA negative, no evidence of granulomatous disease on CT from 2020, he does have single chamber Medtronic ICD implanted initially in 2016, he does have OptiVol stable at the beginning of this year, history of atrial flutter diagnosed in 2020 cardioversion in 2020 amiodarone however discontinued after ablation done still on Xarelto and metoprolol, history of frequent multifocal PVCs diagnosed in December 2024, type 2 diabetes, essential hypertension, dyslipidemia, obstructive sleep apnea however lost significant weight after gastric bypass surgery and does not want to be retested, HIV positive diagnosed in 2012 secondary to needlestick however not detectable virus load in summer 2025, history 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 Recently he saw a team from [facility] and conversation about potential transplant has been initiated.  He was very unhappy about this he does not think that he needed to have a heart transplant. He comes today to our office for follow-up doing great.  Still active can do what he wants to do get short of breath somewhat.  Did have echocardiogram repeated ejection fraction less than 20%, he did see our EP team for his defibrillator.  Overall stable denies of any chest pain tightness squeezing pressure burning chest  Past Medical History:  Diagnosis  Date   A-fib (HCC)    Asthma 09/24/2024   Atypical atrial flutter (HCC) 11/25/2018   CHF (congestive heart failure) (HCC)    CKD (chronic kidney disease) stage 3, GFR 30-59 ml/min (HCC) 02/27/2019   DJD (degenerative joint disease) of knee 10/12/2012   GERD (gastroesophageal reflux disease)    Gout    HIV (human immunodeficiency virus infection) (HCC)    Hyperlipidemia    Hypertension    Hypertrophic scar 08/10/2021   Added automatically from request for surgery [id number]     ICD (implantable cardioverter-defibrillator) in place 05/30/2015   Medtronic Evera MRI XT  DOI  05-12-15;  [facility]     Idiopathic chronic gout of multiple sites without tophus 06/14/2018   Iron deficiency anemia 04/23/2021   Keratoconjunctivitis sicca of both eyes not specified as Sjogren's 07/01/2017   Morbid obesity (HCC) 09/07/2013   S/P ablation of atrial flutter 07/02/2021   S/P bariatric surgery 01/20/2016   Seborrheic dermatitis 09/24/2024   Severe persistent asthma without complication (HCC) 01/04/2018   Sleep apnea 01/04/2018   States sleep study at [facility] in Jan. 2017.  Recommended Cpap but has not   gotten device yet.     Type 2 diabetes mellitus with hypercholesterolemia (HCC) 04/30/2015   Vitamin B 12 deficiency 04/23/2021   Vitamin D deficiency     Past Surgical History:  Procedure Laterality Date   IMPLANTABLE CARDIOVERTER DEFIBRILLATOR IMPLANT     LAPAROSCOPIC GASTRIC SLEEVE RESECTION     PANNICULECTOMY      Current Medications: Current Meds  Medication Sig   albuterol (PROVENTIL HFA;VENTOLIN HFA) 108 (90 Base)  MCG/ACT inhaler INHALE 2 PUFFS INTO THE LUNGS EVERY 6 HOURS AS NEEDED FOR WHEEZING   allopurinol (ZYLOPRIM) 100 MG tablet Take 100 mg by mouth daily.   ascorbic acid (VITAMIN C) 1000 MG tablet Take 1,000 mg by mouth daily.   aspirin 81 MG chewable tablet Chew by mouth.   atorvastatin (LIPITOR) 20 MG tablet Take 20 mg by mouth daily.   bictegravir-emtricitabine-tenofovir AF  (BIKTARVY) 50-200-25 MG TABS tablet TAKE 1 TABLET BY MOUTH DAILY.   Cholecalciferol (VITAMIN D) 2000 units tablet Take by mouth.   colchicine 0.6 MG tablet Take 2 tablets at onset of gout pain, can take a 3rd tablet if needed but no more than 3 tablets in 24 hours.   Cyanocobalamin (VITAMIN B-12) 2000 MCG TBCR Take by mouth.   ENTRESTO 49-51 MG Take 1 tablet by mouth 2 (two) times daily.   eplerenone (INSPRA) 50 MG tablet Take 50 mg by mouth daily.   esomeprazole (NEXIUM) 40 MG capsule TAKE 1 CAPSULE BY MOUTH DAILY   FARXIGA 10 MG TABS tablet Take 10 mg by mouth daily.   ketoconazole (NIZORAL) 2 % shampoo APPLY EXTERNALLY TO THE SKIN DAILY. LEAVE ON FOR 3 TO 5 MINUTES THEN WASH OFF. AVOID EYES   LINZESS 290 MCG CAPS capsule Take 290 mcg by mouth daily.   loratadine (CLARITIN) 10 MG tablet Take 10 mg by mouth daily.   metFORMIN (GLUCOPHAGE) 1000 MG tablet Take by mouth.   metoprolol  succinate (TOPROL -XL) 25 MG 24 hr tablet Take 25 mg by mouth at bedtime.   mometasone (ASMANEX) 220 MCG/INH inhaler Inhale into the lungs.   OZEMPIC, 2 MG/DOSE, 8 MG/3ML SOPN Inject 2 mg into the skin once a week.   Psyllium (METAMUCIL) 48.57 % POWD Use as directed   Rivaroxaban (XARELTO) 15 MG TABS tablet Take 15 mg by mouth daily with supper.   torsemide (DEMADEX) 20 MG tablet Take 20 mg by mouth 3 (three) times a week.   traMADol  (ULTRAM ) 50 MG tablet Take 1 tablet (50 mg total) by mouth every 6 (six) hours as needed.   TRIUMEQ 600-50-300 MG tablet Take 1 tablet by mouth daily.     Allergies:   Niacin, Latex, and Spironolactone   Social History   Socioeconomic History   Marital status: Single    Spouse name: Not on file   Number of children: Not on file   Years of education: Not on file   Highest education level: Not on file  Occupational History   Not on file  Tobacco Use   Smoking status: Never   Smokeless tobacco: Never  Vaping Use   Vaping status: Never Used  Substance and Sexual Activity    Alcohol use: Never   Drug use: Never   Sexual activity: Not on file  Other Topics Concern   Not on file  Social History Narrative   Not on file   Social Drivers of Health   Financial Resource Strain: Not on file  Food Insecurity: No Food Insecurity (11/30/2018)   Received from [facility] visits prior to 01/22/2023.   Hunger Vital Sign    Worried About Running Out of Food in the Last Year: Never true    Ran Out of Food in the Last Year: Never true  Transportation Needs: No Transportation Needs (11/30/2018)   Received from [facility] visits prior to 01/22/2023.   PRAPARE - Administrator, Civil Service (Medical): No    Lack  of Transportation (Non-Medical): No  Physical Activity: Not on file  Stress: Not on file  Social Connections: Not on file     Family History: The patient's family history is not on file. ROS:   Please see the history of present illness.    All 14 point review of systems negative except as described per history of present illness  EKGs/Labs/Other Studies Reviewed:         Recent Labs: 09/24/2024: ALT 12; BUN 30; Creatinine, Ser 1.86; Potassium 3.7; Sodium 144; TSH 1.490  Recent Lipid Panel No results found for: CHOL, TRIG, HDL, CHOLHDL, VLDL, LDLCALC, LDLDIRECT  Physical Exam:    VS:  BP 110/80   Pulse 88   Ht 5' 11 (1.803 m)   Wt 263 lb 12 oz (119.6 kg)   SpO2 96%   BMI 36.79 kg/m     Wt Readings from Last 3 Encounters:  10/25/24 263 lb 12 oz (119.6 kg)  10/08/24 257 lb (116.6 kg)  09/24/24 252 lb (114.3 kg)     GEN:  Well nourished, well developed in no acute distress HEENT: Normal NECK: No JVD; No carotid bruits LYMPHATICS: No lymphadenopathy CARDIAC: RRR, no murmurs, no rubs, no gallops RESPIRATORY:  Clear to auscultation without rales, wheezing or rhonchi  ABDOMEN: Soft, non-tender, non-distended MUSCULOSKELETAL:  No edema; No deformity  SKIN: Warm and dry LOWER  EXTREMITIES: no swelling NEUROLOGIC:  Alert and oriented x 3 PSYCHIATRIC:  Normal affect   ASSESSMENT:    1. Dilated cardiomyopathy (HCC)   2. Chronic systolic heart failure (HCC)   3. Primary hypertension   4. ICD (implantable cardioverter-defibrillator) in place   5. Hypercholesteremia   6. S/P bariatric surgery    PLAN:    In order of problems listed above:  Dilated cardiomyopathy ejection fraction less than 20 I will do EKG today if EKG is fine we will increase dose of his Toprol-XL to 50 mg daily continue rest of the medications, we will refer him to our advanced congestive heart failure clinic. Chronic congestive heart failure compensated on the physical exam today. Essential hypertension blood pressure well-controlled. ICD present follow-up by our EP team. Dyslipidemia, I do not have any recent fasting lipid profile will call primary care physician to get a copy of it   Medication Adjustments/Labs and Tests Ordered: Current medicines are reviewed at length with the patient today.  Concerns regarding medicines are outlined above.  No orders of the defined types were placed in this encounter.  Medication changes: No orders of the defined types were placed in this encounter.   Signed, Lamar DOROTHA Fitch, MD, [facility] 10/25/2024 10:12 AM     [facility]

## 2024-10-25 NOTE — Addendum Note (Signed)
 Addended by: ARLOA PLANAS D on: 10/25/2024 10:39 AM   Modules accepted: Orders

## 2024-11-05 ENCOUNTER — Ambulatory Visit: Admitting: Cardiology

## 2024-11-06 ENCOUNTER — Telehealth: Payer: Self-pay

## 2024-11-06 ENCOUNTER — Ambulatory Visit

## 2024-11-06 VITALS — BP 120/72 | HR 87 | Ht 71.0 in | Wt 254.1 lb

## 2024-11-06 DIAGNOSIS — I10 Essential (primary) hypertension: Secondary | ICD-10-CM

## 2024-11-06 NOTE — Telephone Encounter (Signed)
 Called pt. LVM to continue same medications after nurse visit this AM, Per Dr. Krasowski

## 2024-11-06 NOTE — Telephone Encounter (Signed)
 Pt returning call. Please advise.

## 2024-11-06 NOTE — Progress Notes (Signed)
° °  Nurse Visit   Date of Encounter: 11/06/2024 ID: Levander [name], DOB Jul 30, 1961, MRN [id number]  PCP:  [facility]   [facility] Providers Cardiologist:  Lamar Fitch, MD Electrophysiologist:  Soyla Gladis Norton, MD      Visit Details   VS:  There were no vitals taken for this visit. , BMI There is no height or weight on file to calculate BMI.  Wt Readings from Last 3 Encounters:  10/25/24 263 lb 12 oz (119.6 kg)  10/08/24 257 lb (116.6 kg)  09/24/24 252 lb (114.3 kg)     Reason for visit: EKG Performed today: Vitals, EKG, Provider consulted:Dr. Krasowski, and Education Changes (medications, testing, etc.) : EKG to be reviewed by Dr. Krasowski and pt will be notified of changes. Length of Visit: 20 minutes    Medications Adjustments/Labs and Tests Ordered: Orders Placed This Encounter  Procedures   EKG 12-Lead   No orders of the defined types were placed in this encounter.    Signed, Melene Meissner, RN  11/06/2024 10:00 AM

## 2024-11-07 NOTE — Telephone Encounter (Signed)
 Called the patient and informed him of Dr. Karry recommendation below:  Continue same medications after nurse visit this AM, Per Dr. Bernie     Patient verbalized understanding and had no further questions at this time.

## 2024-11-07 NOTE — Telephone Encounter (Signed)
 Left message for the patient to call back.

## 2025-01-07 ENCOUNTER — Ambulatory Visit

## 2025-04-08 ENCOUNTER — Ambulatory Visit

## 2025-07-08 ENCOUNTER — Ambulatory Visit

## 2025-10-07 ENCOUNTER — Ambulatory Visit

## 2026-01-06 ENCOUNTER — Ambulatory Visit
